# Patient Record
Sex: Female | Born: 1955 | Race: White | Hispanic: No | Smoking: Former smoker
Health system: Southern US, Community
[De-identification: ages and names within clinical notes are randomized; demographics above are authoritative.]

## PROBLEM LIST (undated history)

## (undated) DIAGNOSIS — I10 Essential (primary) hypertension: Secondary | ICD-10-CM

## (undated) DIAGNOSIS — E039 Hypothyroidism, unspecified: Secondary | ICD-10-CM

## (undated) DIAGNOSIS — E785 Hyperlipidemia, unspecified: Secondary | ICD-10-CM

## (undated) DIAGNOSIS — K631 Perforation of intestine (nontraumatic): Secondary | ICD-10-CM

## (undated) HISTORY — DX: Hyperlipidemia, unspecified: E78.5

## (undated) HISTORY — DX: Perforation of intestine (nontraumatic): K63.1

## (undated) HISTORY — DX: Essential (primary) hypertension: I10

## (undated) HISTORY — DX: Hypothyroidism, unspecified: E03.9

## (undated) HISTORY — PX: BREAST BIOPSY: SHX20

---

## 2008-01-31 DIAGNOSIS — K631 Perforation of intestine (nontraumatic): Secondary | ICD-10-CM

## 2008-01-31 HISTORY — DX: Perforation of intestine (nontraumatic): K63.1

## 2008-02-24 ENCOUNTER — Inpatient Hospital Stay (HOSPITAL_COMMUNITY): Admission: EM | Admit: 2008-02-24 | Discharge: 2008-02-29 | Payer: Self-pay | Admitting: Emergency Medicine

## 2009-01-22 ENCOUNTER — Encounter: Admission: RE | Admit: 2009-01-22 | Discharge: 2009-01-22 | Payer: Self-pay | Admitting: Family Medicine

## 2009-12-15 ENCOUNTER — Encounter: Admission: RE | Admit: 2009-12-15 | Discharge: 2009-12-15 | Payer: Self-pay | Admitting: Family Medicine

## 2010-06-15 ENCOUNTER — Encounter: Admission: RE | Admit: 2010-06-15 | Discharge: 2010-06-15 | Payer: Self-pay | Admitting: Obstetrics & Gynecology

## 2011-03-16 NOTE — Consult Note (Signed)
[name], [name]            ACCOUNT NO.:  [id number]   MEDICAL RECORD NO.:  [id number]          PATIENT TYPE:  INP   LOCATION:  5118                         FACILITY:  [facility]   PHYSICIAN:  [name], M.D. DATE OF BIRTH:  September 20, 1956   DATE OF CONSULTATION:  02/24/2008  DATE OF DISCHARGE:                                 CONSULTATION   REASON FOR CONSULTATION:  Free air after colonoscopy.   HISTORY OF PRESENT ILLNESS:  The patient is a 55 year old white female  in excellent health, who underwent a screening colonoscopy yesterday by  Dr. Charlott Terry.  According to his report, he performed a hot  polypectomy of 2 diminutive polyps at the splenic flexure.  He also took  several random cold biopsies from the remainder of the colon.  After the  patient returned to home, she began having increase in abdominal pain  and distention.  This was located mostly in the midline.  She developed  nausea and vomiting as well as diarrhea, which was nonbloody this  morning.  She remains afebrile.  With the worsening pain, the patient  came to the emergency department for evaluation.  Plain films showed a  tiny amount of free air and a prominent loop of jejunum consistent  with a mild jejunal ileus.  Dr. Ewing Terry is admitting the patient and we  are asked to consult.   PAST MEDICAL HISTORY:  Hypertension.   PAST SURGICAL HISTORY:  Some type of gynecologic vaginal exam under  anesthesia.   ALLERGIES:  ASPIRIN, which causes angioedema.   MEDICATIONS:  None.   SOCIAL HISTORY:  Nonsmoker, occasional alcohol use.   FAMILY HISTORY:  Noncontributory.   PHYSICAL EXAMINATION:  VITAL SIGNS:  Temperature 97.4, pulse 73, blood  pressure 116/78, respirations 80, and saturation is 98% on room air.  GENERAL:  This is a well-developed and well-nourished female in no  apparent distress.  HEENT:  EOMI.  Sclerae anicteric.  NECK:  No masses.  No thyromegaly.  LUNGS:  Clear.  Normal respiratory  effort.  HEART:  Regular rate and rhythm.  No murmur.  ABDOMEN:  Occasional bowel sounds, soft, and nondistended.  Tender along  the midline.  No rebound.  No guarding.  No palpable masses.  EXTREMITIES:  No edema.  SKIN:  Warm and dry with no sign of jaundice.   LABORATORY DATA:  White count 6.9, hemoglobin 13.8, and platelets  223,000.  Electrolytes within normal limits.   IMPRESSION:  A healthy 55 year old female with apparent perforation  after colonoscopy and biopsy.  She has localized abdominal tenderness  with no systemic signs of sepsis or generalized peritonitis.   RECOMMENDATIONS:  We will obtain a CT scan of the abdomen and pelvis  with IV contrast to rule out any abscess.  If there is no sign of  abscess and this is a localized contained perforation, we will try to  manage this conservatively with IV antibiotics and bowel rest.  If her  clinical status worsens, she will need exploration with repair of her  colon versus possible colon resection.  We will continue to follow with  you.  Discussed with Dr. Ewing Terry.      [name], M.D.  Electronically Signed     MKT/MEDQ  D:  02/24/2008  T:  02/25/2008  Job:  [id number]

## 2011-03-16 NOTE — H&P (Signed)
[name], GRAJALES            ACCOUNT NO.:  [id number]   MEDICAL RECORD NO.:  [id number]          PATIENT TYPE:  INP   LOCATION:  5118                         FACILITY:  [facility]   PHYSICIAN:  Petra Kuba, M.D.    DATE OF BIRTH:  [birth year]   DATE OF ADMISSION:  02/24/2008  DATE OF DISCHARGE:                              HISTORY & PHYSICAL   HISTORY:  The admitted with free air after colonoscopy.  She did have a  little bit of cramps afterwards, but today after eating a little low  meal got significant abdominal pain, called me, sounded in moderate  distress, had her son bring her to the emergency room where free air was  seen on flat and upright abdomen.  She has had no nausea, vomiting, or  fever, has not passed any gas.  She has had no previous GI issues.  Dr.  Bosie Clos did remove 2 polyps.  Unfortunately, they were done in our  office and I do not have that report at the current time.  I have put in  a call to Dr. Bosie Clos and our office manager to see if we can get the  reports of where the polyps were removed from, etc.   PAST MEDICAL HISTORY:  Fairly unremarkable.  She has some mild history  of hypertension and an exploratory lap by a gynecologist years ago.   FAMILY HISTORY:  Negative for any GI problems.   SOCIAL HISTORY:  Does not drink or smoke.  Minimizes over-the-counter  medicine use.   ALLERGIES:  ASPIRIN.   REVIEW OF SYSTEMS:  Negative except as above.   PHYSICAL EXAMINATION:  GENERAL:  No acute distress, although mildly  uncomfortable and lying in the bed.  VITAL SIGNS:  Stable, afebrile.  NECK:  Supple without adenopathy.  LUNGS:  Clear.  HEART:  Regular rate and rhythm.  ABDOMEN:  Surprisingly soft, rare bowel sounds, tender throughout, no  obvious rebound or significant guarding.   X-ray reported to me with some mild free air.  CBC is normal.  White  count 6.8, hemoglobin 13.8, platelet count 238.  Chemistries are normal  including albumin 3.9,  lipase normal, nonacetic.  BUN and creatinine  normal.   ASSESSMENT:  Colon perforation status post colonoscopy.   PLAN:  I have discussed case with Dr. Corliss Skains the surgeon and he will see  her to see if surgical options are needed or we should proceed with a  CAT scan.  In the meantime, we will use Cipro and Flagyl, keep her  comfortable with  Dilaudid and Phenergan and Tylenol p.r.n., try to get the colonoscopy  report or get in touch with Dr. Bosie Clos as above, and wait on Dr.  Fatima Sanger opinion for further workup plan, and I have discussed all the  above with both the patient and her son and they understands.           ______________________________  Petra Kuba, M.D.     MEM/MEDQ  D:  02/24/2008  T:  02/24/2008  Job:  [id number]   cc:   Wilmon Arms. Tsuei, M.D.  Vincent C.  Bosie Clos, MD

## 2011-03-19 NOTE — Discharge Summary (Signed)
NAMEJAICE, [name]            ACCOUNT NO.:  [id number]   MEDICAL RECORD NO.:  [id number]          PATIENT TYPE:  INP   LOCATION:  5154                         FACILITY:  [facility]   PHYSICIAN:  Shirley Friar, MDDATE OF BIRTH:  July 12, 1956   DATE OF ADMISSION:  02/24/2008  DATE OF DISCHARGE:  02/29/2008                               DISCHARGE SUMMARY   DISCHARGE DIAGNOSIS:  Microperforation of the colon, status post  polypectomy.   PROCEDURES:  None.   CONSULTS:  [facility] Surgery, Dr. Manus Rudd, February 24, 2008.   RADIOLOGICAL EXAMS:  1. Acute abdominal series taken on February 24, 2008, that showed tiny      amount of free peritoneal air compatible with bowel perforation.  2. Mild jejunal ileus.  3. CT of her abdomen and pelvis done on February 24, 2008, that showed      trace intraperitoneal free air as reported on prior abdominal      radiograph and CT of the pelvis showed short segmental colonic wall      thickening.  Colonic stranding and foci of free air are adjacent to      the mild mid transverse colon, likely the site of perforation, also      mild pelvic free fluid.  4. Chest x-ray February 27, 2008 done for nonproductive cough.   IMPRESSION:  Scarring left CP angle.  No acute cardiopulmonary disease.   BRIEF HISTORY AND PHYSICAL:  [name] is a 55 year old Caucasian  female who had some abdominal pain after colonoscopy with polypectomy.  She went to the emergency room where a flat and upright abdominal x-ray  showed free air.  She had no nausea, vomiting, or fever.  Her exam was  benign.  Her abdomen was soft with rare bowel sounds, tender throughout.  No obvious rebound or significant guarding.  CBC count on admission was  normal.  She was admitted to the hospital for observation.  A CT scan  was obtained to further clarify the microperforation.  A surgery consult  was obtained.  Dr. Manus Rudd saw the patient and felt that  conservative management  was the most appropriate course of action.  The  patient was observed over the next few days in the hospital.  Stool  cultures were obtained.  Routine stool culture showed reduced flora.  C.  diff was negative.  Cryptosporidium and Giardia were negative.  The  patient developed a nonproductive cough in the hospital.  Chest x-ray  was negative.  She also developed a small temperature of 100.3.  She was  treated with Cipro and Flagyl as well as Dilaudid pain medication and  Phenergan in the hospital.  Over the course of several days,  her pain  decreased.  She was able to tolerate first a clear-liquid diet and then  a full diet.  She was able to ambulate without pain.  The day of her  discharge, she was pain free, eating well, ambulating well, and was  ready to go home.   LABORATORY DATA:  Labs on February 28, 2008, which was the date of  discharge:  BMET  was within normal limits including a BUN of 4 and  creatinine 0.75.  CBC showed a hemoglobin of 12.1, hematocrit 35.6,  white count 5.2, and platelets 264,000.  The patient was discharged to  home in good condition.  She was given prescriptions for  14 days of Cipro 500 mg b.i.d. and Flagyl 500 mg t.i.d. as well as  Phenergan for any nausea at that she might have at home.  She was given  a followup appointment in 2 weeks with Dr. Bosie Clos and was advised not  to take any NSAIDs or aspirin and to call our office with increased pain  or fever.      Stephani Police, [place]      Shirley Friar, MD  Electronically Signed    MLY/MEDQ  D:  04/01/2008  T:  04/02/2008  Job:  [id number]   cc:   Shirley Friar, MD

## 2011-07-27 LAB — URINE MICROSCOPIC-ADD ON

## 2011-07-27 LAB — CBC
HCT: 33.1 — ABNORMAL LOW
HCT: 33.8 — ABNORMAL LOW
HCT: 35.6 — ABNORMAL LOW
Hemoglobin: 11.5 — ABNORMAL LOW
MCHC: 34.1
MCV: 94.2
MCV: 94.4
MCV: 95.4
Platelets: 170
Platelets: 182
Platelets: 205
Platelets: 223
Platelets: 264
RBC: 3.56 — ABNORMAL LOW
RBC: 3.59 — ABNORMAL LOW
RBC: 4.28
RDW: 12.6
WBC: 5.1
WBC: 5.2
WBC: 6.9
WBC: 7.5

## 2011-07-27 LAB — BASIC METABOLIC PANEL
BUN: 2 — ABNORMAL LOW
CO2: 23
CO2: 26
Calcium: 8.8
Chloride: 105
Chloride: 106
Creatinine, Ser: 0.72
Creatinine, Ser: 0.75
Creatinine, Ser: 0.78
GFR calc Af Amer: >60
GFR calc Af Amer: >60
GFR calc Af Amer: >60
GFR calc non Af Amer: >60
GFR calc non Af Amer: >60
Glucose, Bld: 127 — ABNORMAL HIGH
Glucose, Bld: 95
Potassium: 3.5
Sodium: 139
Sodium: 141

## 2011-07-27 LAB — COMPREHENSIVE METABOLIC PANEL
AST: 34
Albumin: 3.9
CO2: 24
Calcium: 9.1
Chloride: 104
Glucose, Bld: 118 — ABNORMAL HIGH
Potassium: 3.6
Sodium: 138
Total Protein: 7

## 2011-07-27 LAB — URINALYSIS, ROUTINE W REFLEX MICROSCOPIC
Glucose, UA: NEGATIVE
Ketones, ur: 40 — AB
Nitrite: NEGATIVE
Protein, ur: NEGATIVE
Specific Gravity, Urine: 1.024

## 2011-07-27 LAB — DIFFERENTIAL
Basophils Absolute: 0
Basophils Relative: 0
Eosinophils Absolute: 0
Eosinophils Relative: 1
Lymphocytes Relative: 15
Lymphs Abs: 1
Neutro Abs: 5.5
Neutrophils Relative %: 80 — ABNORMAL HIGH

## 2011-07-27 LAB — STOOL CULTURE

## 2011-07-27 LAB — GIARDIA/CRYPTOSPORIDIUM SCREEN(EIA): Cryptosporidium Screen (EIA): NEGATIVE

## 2011-08-02 ENCOUNTER — Other Ambulatory Visit: Payer: Self-pay | Admitting: Obstetrics & Gynecology

## 2011-08-02 DIAGNOSIS — Z1231 Encounter for screening mammogram for malignant neoplasm of breast: Secondary | ICD-10-CM

## 2011-08-10 ENCOUNTER — Ambulatory Visit
Admission: RE | Admit: 2011-08-10 | Discharge: 2011-08-10 | Disposition: A | Discharge status: Home / Self Care | Payer: No Typology Code available for payment source | Source: Ambulatory Visit | Attending: Obstetrics & Gynecology | Admitting: Obstetrics & Gynecology

## 2011-08-10 DIAGNOSIS — Z1231 Encounter for screening mammogram for malignant neoplasm of breast: Secondary | ICD-10-CM

## 2012-07-26 ENCOUNTER — Other Ambulatory Visit: Payer: Self-pay | Admitting: Gynecology

## 2012-08-10 ENCOUNTER — Other Ambulatory Visit: Payer: Self-pay | Admitting: Obstetrics & Gynecology

## 2012-08-10 ENCOUNTER — Other Ambulatory Visit: Payer: Self-pay | Admitting: Gynecology

## 2012-08-10 DIAGNOSIS — Z1231 Encounter for screening mammogram for malignant neoplasm of breast: Secondary | ICD-10-CM

## 2012-08-24 ENCOUNTER — Ambulatory Visit
Admission: RE | Admit: 2012-08-24 | Discharge: 2012-08-24 | Disposition: A | Discharge status: Home / Self Care | Payer: 59 | Source: Ambulatory Visit | Attending: Gynecology | Admitting: Gynecology

## 2012-08-24 DIAGNOSIS — Z1231 Encounter for screening mammogram for malignant neoplasm of breast: Secondary | ICD-10-CM

## 2012-11-01 DIAGNOSIS — E039 Hypothyroidism, unspecified: Secondary | ICD-10-CM

## 2012-11-01 HISTORY — DX: Hypothyroidism, unspecified: E03.9

## 2013-10-04 ENCOUNTER — Telehealth: Payer: Self-pay | Admitting: Gynecology

## 2013-10-04 NOTE — Telephone Encounter (Signed)
Patient calling needing refills on thyroid medication. Patient was due for AEX 07/26/13 but declines to schedule due to no insurance coverage at this time. She may have coverage starting January 2015 but does not want to schedule until she is sure.  [facility]  [address] [employer]

## 2013-10-04 NOTE — Telephone Encounter (Signed)
Last TSH check 11/13/12 = 1.205 Please advise if OK to refill.  Pt is overdue for AEX and does not want to schedule at this time.

## 2013-10-05 MED ORDER — LEVOTHYROXINE SODIUM 75 MCG PO TABS: 75.0000 ug | ORAL_TABLET | Freq: Every day | ORAL | Status: DC

## 2013-10-05 NOTE — Telephone Encounter (Signed)
Ok to refill for 63m only, thereafter will need both ov and lab to confirm right dose, she needs appt in January

## 2013-10-05 NOTE — Telephone Encounter (Signed)
Appt scheduled for 11/21/13, first available for Dr. Farrel Gobble.  RX sent until that time.

## 2013-11-20 ENCOUNTER — Encounter: Payer: Self-pay | Admitting: Gynecology

## 2013-11-21 ENCOUNTER — Encounter: Payer: Self-pay | Admitting: Gynecology

## 2013-11-21 ENCOUNTER — Ambulatory Visit (INDEPENDENT_AMBULATORY_CARE_PROVIDER_SITE_OTHER): Payer: 59 | Admitting: Gynecology

## 2013-11-21 VITALS — BP 140/88 | HR 80 | Resp 18 | Ht 66.0 in | Wt 148.0 lb

## 2013-11-21 DIAGNOSIS — E039 Hypothyroidism, unspecified: Secondary | ICD-10-CM

## 2013-11-21 DIAGNOSIS — Z01419 Encounter for gynecological examination (general) (routine) without abnormal findings: Secondary | ICD-10-CM

## 2013-11-21 LAB — TSH: TSH: 1.694 u[IU]/mL (ref 0.350–4.500)

## 2013-11-21 MED ORDER — LEVOTHYROXINE SODIUM 75 MCG PO TABS: 75.0000 ug | ORAL_TABLET | Freq: Every day | ORAL | Status: DC

## 2013-11-21 NOTE — Patient Instructions (Signed)

## 2013-11-21 NOTE — Progress Notes (Signed)
58 y.o.  G22P2002 Divorced Caucasian female  here for annual exam. Pt reports menses are absent.  She does report hot flashes (depends) , does not have night sweats, does not have vaginal dryness.  She is not using lubricants.  She does not report post-menopasual bleeding.  Pt lost job last year, is getting retraining  Patient's last menstrual period was 11/21/2006.          Sexually active: no  The current method of family planning is post menopausal status.    Exercising: yes  walking 2-3x/wk Last pap: 07/26/12 NEG HR HPV Abnormal PAP: Mammogram: 08/25/12 Bi-Rads BSE: no Colonoscopy: 2010 f/u on 10 years DEXA: 2010 Alcohol: 4-5 Drinks/wk Tobacco: no  Labs: Cammie Fulp,MD  ; Urine: Unable to void  Health Maintenance  Topic Date Due  . Influenza Vaccine  06/01/2013  . Mammogram  08/24/2014  . Pap Smear  07/27/2015  . Colonoscopy  11/01/2018  . Tetanus/tdap  11/01/2018    Family History  Problem Relation Age of Onset  . Cancer Maternal Grandmother   . Cancer Maternal Grandfather   . Diabetes Paternal Grandfather     There are no active problems to display for this patient.   Past Medical History  Diagnosis Date  . Colon perforation 01/2008    With Colonoscopy  . Hyperlipidemia   . Hypertension     History reviewed. No pertinent past surgical history.  Allergies: Asa and Motrin  Current Outpatient Prescriptions  Medication Sig Dispense Refill  . Calcium 1200-1000 MG-UNIT CHEW Chew by mouth.      . levothyroxine (SYNTHROID) 75 MCG tablet Take 1 tablet (75 mcg total) by mouth daily before breakfast.  30 tablet  1  . Multiple Vitamins-Minerals (MULTIVITAMIN PO) Take by mouth.      . rosuvastatin (CRESTOR) 10 MG tablet Take 10 mg by mouth every other day.      . Coenzyme Q10 (COQ10 PO) Take by mouth.      Marland Kitchen MAGNESIUM PO Take by mouth.       No current facility-administered medications for this visit.    ROS: Pertinent items are noted in HPI.  Exam:    BP 140/88   Pulse 80  Resp 18  Ht 5\' 6"  (1.676 m)  Wt 148 lb (67.132 kg)  BMI 23.90 kg/m2  LMP 11/21/2006 Weight change: @WEIGHTCHANGE @ Last 3 height recordings:  Ht Readings from Last 3 Encounters:  11/21/13 5\' 6"  (1.676 m)   General appearance: alert, cooperative and appears stated age Head: Normocephalic, without obvious abnormality, atraumatic Neck: no adenopathy, no carotid bruit, no JVD, supple, symmetrical, trachea midline and thyroid not enlarged, symmetric, no tenderness/mass/nodules Lungs: clear to auscultation bilaterally Breasts: normal appearance, no masses or tenderness Heart: regular rate and rhythm, S1, S2 normal, no murmur, click, rub or gallop Abdomen: soft, non-tender; bowel sounds normal; no masses,  no organomegaly Extremities: extremities normal, atraumatic, no cyanosis or edema Skin: Skin color, texture, turgor normal. No rashes or lesions Lymph nodes: Cervical, supraclavicular, and axillary nodes normal. no inguinal nodes palpated Neurologic: Grossly normal   Pelvic: External genitalia:  no lesions              Urethra: normal appearing urethra with no masses, tenderness or lesions              Bartholins and Skenes: normal                 Vagina: normal appearing vagina with normal color and discharge, no  lesions              Cervix: normal appearance              Pap taken: no        Bimanual Exam:  Uterus:  uterus is normal size, shape, consistency and nontender                                      Adnexa:    normal adnexa in size, nontender and no masses                                      Rectovaginal: Confirms                                      Anus:  normal sphincter tone, no lesions  A: well 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  An After Visit Summary was printed and given to the patient.

## 2013-11-21 NOTE — Addendum Note (Signed)
Addended by: Douglass RiversLATHROP, Jyles Sontag on: 11/21/2013 12:58 PM   Modules accepted: Orders

## 2013-11-22 ENCOUNTER — Telehealth: Payer: Self-pay | Admitting: *Deleted

## 2013-11-22 NOTE — Telephone Encounter (Signed)
Left Message To Call Back  

## 2013-11-22 NOTE — Telephone Encounter (Signed)
Patient notified

## 2013-11-22 NOTE — Telephone Encounter (Signed)
Message copied by Lorraine LaxSHAW, Lynda Wanninger J on Thu Nov 22, 2013 12:52 PM ------      Message from: Douglass RiversLATHROP, TRACY      Created: Thu Nov 22, 2013  9:56 AM       Inform TSH great, ok to pick up synthroid ------

## 2013-11-22 NOTE — Telephone Encounter (Signed)
See labs 

## 2013-11-22 NOTE — Telephone Encounter (Signed)
Pt returning call

## 2013-11-30 ENCOUNTER — Encounter: Payer: Self-pay | Admitting: Gynecology

## 2013-11-30 DIAGNOSIS — E039 Hypothyroidism, unspecified: Secondary | ICD-10-CM

## 2013-11-30 MED ORDER — LEVOTHYROXINE SODIUM 75 MCG PO TABS: 75.0000 ug | ORAL_TABLET | Freq: Every day | ORAL | Status: DC

## 2014-09-02 ENCOUNTER — Encounter: Payer: Self-pay | Admitting: Gynecology

## 2014-09-30 ENCOUNTER — Telehealth: Payer: Self-pay

## 2014-09-30 NOTE — Telephone Encounter (Signed)
S/W pt about rescheduling her appt with Dr. Farrel GobbleLathrop. New appt set on 11/22/14 @ 10:30 with Ms. Patty

## 2014-11-22 ENCOUNTER — Ambulatory Visit: Payer: Self-pay | Admitting: Nurse Practitioner

## 2014-11-22 ENCOUNTER — Ambulatory Visit: Payer: 59 | Admitting: Gynecology

## 2014-11-26 ENCOUNTER — Ambulatory Visit (INDEPENDENT_AMBULATORY_CARE_PROVIDER_SITE_OTHER): Payer: BLUE CROSS/BLUE SHIELD | Admitting: Nurse Practitioner

## 2014-11-26 ENCOUNTER — Encounter: Payer: Self-pay | Admitting: Nurse Practitioner

## 2014-11-26 ENCOUNTER — Other Ambulatory Visit: Payer: Self-pay | Admitting: Nurse Practitioner

## 2014-11-26 VITALS — BP 122/80 | HR 78 | Resp 14 | Ht 65.5 in | Wt 145.4 lb

## 2014-11-26 DIAGNOSIS — E559 Vitamin D deficiency, unspecified: Secondary | ICD-10-CM

## 2014-11-26 DIAGNOSIS — Z1231 Encounter for screening mammogram for malignant neoplasm of breast: Secondary | ICD-10-CM

## 2014-11-26 DIAGNOSIS — E038 Other specified hypothyroidism: Secondary | ICD-10-CM

## 2014-11-26 DIAGNOSIS — Z01419 Encounter for gynecological examination (general) (routine) without abnormal findings: Secondary | ICD-10-CM

## 2014-11-26 DIAGNOSIS — M8580 Other specified disorders of bone density and structure, unspecified site: Secondary | ICD-10-CM

## 2014-11-26 LAB — TSH: TSH: 1.993 u[IU]/mL (ref 0.350–4.500)

## 2014-11-26 MED ORDER — LEVOTHYROXINE SODIUM 75 MCG PO TABS: 75.0000 ug | ORAL_TABLET | Freq: Every day | ORAL | Status: DC

## 2014-11-26 NOTE — Progress Notes (Signed)
59 y.o. G36P2002 Divorced Caucasian Fe here for annual exam.  Not dating or SA for 15 years.  Patient's last menstrual period was 11/01/2006.          Sexually active: No.  The current method of family planning is post menopausal status.    Exercising: No.  The patient does not participate in regular exercise at present. Smoker:  no  Health Maintenance: Pap:  07/26/12 NEG HR HPV negative MMG:  08/25/12 Bi-Rads 1: scheduled 12/03/14 Colonoscopy:  01/2008 colon perforation.   Pre-cancerous Polyps f/u needed in  5 years BMD: 2008 - osteopenia  TDaP:  11/01/2008 Labs: Cain Saupe, MD   reports that she has quit smoking. She does not have any smokeless tobacco history on file. She reports that she drinks about 1.8 oz of alcohol per week. She reports that she does not use illicit drugs.  Past Medical History  Diagnosis Date  . Colon perforation 01/2008    With Colonoscopy  . Hyperlipidemia   . Hypertension     History reviewed. No pertinent past surgical history.  Current Outpatient Prescriptions  Medication Sig Dispense Refill  . Calcium 1200-1000 MG-UNIT CHEW Chew by mouth.    . levothyroxine (SYNTHROID) 75 MCG tablet Take 1 tablet (75 mcg total) by mouth daily before breakfast. 90 tablet 3  . Multiple Vitamins-Minerals (MULTIVITAMIN PO) Take by mouth.    . rosuvastatin (CRESTOR) 10 MG tablet Take 10 mg by mouth every other day.    . Coenzyme Q10 (COQ10 PO) Take by mouth.    Marland Kitchen MAGNESIUM PO Take by mouth.     No current facility-administered medications for this visit.    Family History  Problem Relation Age of Onset  . Cancer Maternal Grandmother   . Cancer Maternal Grandfather   . Diabetes Paternal Grandfather     ROS:  Pertinent items are noted in HPI.  Otherwise, a comprehensive ROS was negative.  Exam:   BP 122/80 mmHg  Pulse 78  Resp 14  Ht 5' 5.5" (1.664 m)  Wt 145 lb 6.4 oz (65.953 kg)  BMI 23.82 kg/m2  LMP 11/01/2006 Height: 5' 5.5" (166.4 cm) Ht Readings from  Last 3 Encounters:  11/26/14 5' 5.5" (1.664 m)  11/21/13  (1.676 m)    General appearance: alert, cooperative and appears stated age Head: Normocephalic, without obvious abnormality, atraumatic Neck: no adenopathy, supple, symmetrical, trachea midline and thyroid normal to inspection and palpation Lungs: clear to auscultation bilaterally Breasts: normal appearance, no masses or tenderness Heart: regular rate and rhythm Abdomen: soft, non-tender; no masses,  no organomegaly Extremities: extremities normal, atraumatic, no cyanosis or edema Skin: Skin color, texture, turgor normal. No rashes or lesions Lymph nodes: Cervical, supraclavicular, and axillary nodes normal. No abnormal inguinal nodes palpated Neurologic: Grossly normal   Pelvic: External genitalia:  no lesions              Urethra:  normal appearing urethra with no masses, tenderness or lesions              Bartholin's and Skene's: normal                 Vagina: normal appearing vagina with normal color and discharge, no lesions              Cervix: anteverted              Pap taken: No. 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

## 2014-11-26 NOTE — Patient Instructions (Signed)

## 2014-11-26 NOTE — Progress Notes (Signed)
[facility] and schedule patient for screening mammogram  12/03/14 at 4:00, patient is aware.

## 2014-11-27 LAB — VITAMIN D 25 HYDROXY (VIT D DEFICIENCY, FRACTURES): Vit D, 25-Hydroxy: 32 ng/mL (ref 30–100)

## 2014-11-28 NOTE — Progress Notes (Signed)
Reviewed personally.  M. Suzanne Cheryle Dark, MD.  

## 2014-12-03 ENCOUNTER — Ambulatory Visit: Payer: Self-pay

## 2014-12-04 ENCOUNTER — Ambulatory Visit
Admission: RE | Admit: 2014-12-04 | Discharge: 2014-12-04 | Disposition: A | Discharge status: Home / Self Care | Payer: BLUE CROSS/BLUE SHIELD | Source: Ambulatory Visit | Attending: Nurse Practitioner | Admitting: Nurse Practitioner

## 2014-12-04 ENCOUNTER — Telehealth: Payer: Self-pay | Admitting: Nurse Practitioner

## 2014-12-04 DIAGNOSIS — M8580 Other specified disorders of bone density and structure, unspecified site: Secondary | ICD-10-CM

## 2014-12-04 DIAGNOSIS — Z1231 Encounter for screening mammogram for malignant neoplasm of breast: Secondary | ICD-10-CM

## 2014-12-04 NOTE — Telephone Encounter (Signed)
Olegario MessierKathy is calling from the [facility] still waiting to get information on the last bone density. Pt is in the office right now.

## 2014-12-04 NOTE — Telephone Encounter (Signed)
[facility] and told them she has had a previous bone density. They need to know where and when as they have no baseline.

## 2014-12-04 NOTE — Telephone Encounter (Signed)
Valerie LevyCathy Terry [facility].  [facility], patient reported having a dexa in the past. Paper chart review, last Dexa was 2008 done by [facility].  Dx of Osteopenia on record.   Gave information to Wrightathy. She wants to know if the physician wants a comparison to 2008, if so, patient will need to have Dexa at [facility].   Advised would route question to provider to review and advise.

## 2014-12-05 ENCOUNTER — Ambulatory Visit
Admission: RE | Admit: 2014-12-05 | Discharge: 2014-12-05 | Disposition: A | Discharge status: Home / Self Care | Payer: BLUE CROSS/BLUE SHIELD | Source: Ambulatory Visit | Attending: Nurse Practitioner | Admitting: Nurse Practitioner

## 2014-12-05 NOTE — Telephone Encounter (Signed)
Regarding previous messages: Lyn[facility] stating [facility] because they weren't sure about the comparison. States they spoke with [facility] and b/c Ms Ascencion DikeBroadwell not a pt there they will not complete her scan. Lynden AngCathy states she will call pt to [facility].

## 2014-12-05 NOTE — Telephone Encounter (Signed)
No [facility] since she is no longer a [facility]. At [facility].

## 2014-12-05 NOTE — Telephone Encounter (Signed)
[facility] Will close encounter.

## 2014-12-20 ENCOUNTER — Telehealth: Payer: Self-pay | Admitting: Nurse Practitioner

## 2014-12-20 NOTE — Telephone Encounter (Signed)
Spoke with patient. Advised patient of results as seen below. Patient is agreeable. Consult appointment scheduled with Dr.Miller on 3/21 at 3pm. Patient is agreeable to date and time.  Routing to provider for final review. Patient agreeable to disposition. Will close encounter

## 2014-12-20 NOTE — Telephone Encounter (Signed)
Result Notes     Notes Recorded by Valerie FranklinPatricia Rolen-Grubb, FNP on 12/17/2014 at 7:35 PM Results via my chart: Dr. Hyacinth Terry initially established care with her and I feel she needs a consult visit to discuss this BMD. Please call her and see if questions but also schedule a consult visit. I will send a copy of this to Dr. Hyacinth Terry as well.  [name], Sorry for the delay in getting the bone density results back to you. We have to wait on the paper copy to reach [name] for an accurate comparison reading. The BMD shows a T Score: spine -2.6; left hip neck at -2.4. The lowest measurement at the spine is in the Osteoporosis range.  The last BMD done at [facility] on 05/03/2007 shows a T Score at the spine of -1.5 and left hip neck at -0.8. Even though these are different machines; this is still quite a loss from previous reading.  I recommend that you have a consult visit with Dr. Hyacinth Terry and review if there are other reasons that your BMD is so low at this time. We can do other labs to make sure no other problems and then discuss what the treatment options are. Last TSH and Vit D were acceptable ranges.

## 2014-12-20 NOTE — Telephone Encounter (Signed)
Pt requesting results from bone density test completed 2 weeks ago.

## 2015-01-20 ENCOUNTER — Institutional Professional Consult (permissible substitution): Payer: BLUE CROSS/BLUE SHIELD | Admitting: Obstetrics & Gynecology

## 2015-01-24 ENCOUNTER — Encounter: Payer: Self-pay | Admitting: Obstetrics & Gynecology

## 2015-01-27 ENCOUNTER — Institutional Professional Consult (permissible substitution): Payer: BLUE CROSS/BLUE SHIELD | Admitting: Obstetrics & Gynecology

## 2015-01-27 NOTE — Telephone Encounter (Signed)
Appointment cancelled. Family emergency. Patient had appointment scheduled for today, 01/27/15 with Dr. Hyacinth MeekerMiller.  Patient states in message she will call back to schedule.       Cc Starla Curl

## 2015-11-13 ENCOUNTER — Other Ambulatory Visit: Payer: Self-pay

## 2015-11-13 DIAGNOSIS — Z1231 Encounter for screening mammogram for malignant neoplasm of breast: Secondary | ICD-10-CM

## 2015-12-01 ENCOUNTER — Ambulatory Visit: Payer: BLUE CROSS/BLUE SHIELD | Admitting: Nurse Practitioner

## 2015-12-08 ENCOUNTER — Ambulatory Visit (INDEPENDENT_AMBULATORY_CARE_PROVIDER_SITE_OTHER): Payer: BLUE CROSS/BLUE SHIELD | Admitting: Nurse Practitioner

## 2015-12-08 ENCOUNTER — Encounter: Payer: Self-pay | Admitting: Nurse Practitioner

## 2015-12-08 ENCOUNTER — Ambulatory Visit: Payer: BLUE CROSS/BLUE SHIELD

## 2015-12-08 VITALS — BP 160/98 | HR 68 | Ht 65.5 in | Wt 146.0 lb

## 2015-12-08 DIAGNOSIS — Z01419 Encounter for gynecological examination (general) (routine) without abnormal findings: Secondary | ICD-10-CM | POA: Diagnosis not present

## 2015-12-08 DIAGNOSIS — E038 Other specified hypothyroidism: Secondary | ICD-10-CM | POA: Diagnosis not present

## 2015-12-08 DIAGNOSIS — E559 Vitamin D deficiency, unspecified: Secondary | ICD-10-CM

## 2015-12-08 DIAGNOSIS — Z0000 Encounter for general adult medical examination without abnormal findings: Secondary | ICD-10-CM

## 2015-12-08 DIAGNOSIS — M8580 Other specified disorders of bone density and structure, unspecified site: Secondary | ICD-10-CM

## 2015-12-08 MED ORDER — LEVOTHYROXINE SODIUM 75 MCG PO TABS: 75.0000 ug | ORAL_TABLET | Freq: Every day | ORAL | Status: AC

## 2015-12-08 NOTE — Progress Notes (Signed)
Patient scheduled for PCP Follow up with Dr. Jillyn Hidden for 12/12/15 at 10:15.

## 2015-12-08 NOTE — Progress Notes (Signed)
Patient ID: [name], female   DOB: 1956-09-25, 60 y.o.   MRN: [id number]  60 y.o. G38P2002 Divorced  Caucasian Fe here for annual exam.  No new diagnosis.  Will  notice that BP is up and down.  Has BP  cuff at home and will monitor.  We have discussed BMD again today and she will return for a consult visit with Dr. Hyacinth Meeker - last apt was cancelled because of travel out of town.  Ex husband (not father of her sons) died this past year.   Patient's last menstrual period was 11/01/2006.          Sexually active: No.  The current method of family planning is none.    Exercising: Yes.    walking at least five days per week Smoker:  no  Health Maintenance: Pap: 07/26/12 NEG HR HPV negative MMG:12/04/14, Bi-Rads 1: Negative; scheduled 12/17/15 Colonoscopy: 01/2008 colon perforation.Due this year, will schedule. BMD: 12/05/14 T Score, -2.6 Spine / -2.4 Left Femur Neck TDaP: 11/01/2008 Shingles: Never, discussed with Dr. Jillyn Hidden Hep C and HIV: discussed today Labs: Dr. Jillyn Hidden   reports that she quit smoking about 17 years ago. She has never used smokeless tobacco. She reports that she drinks about 1.8 oz of alcohol per week. She reports that she does not use illicit drugs.  Past Medical History  Diagnosis Date  . Colon perforation ([facility]) 01/2008    With Colonoscopy  . Hyperlipidemia   . Hypertension   . Hypothyroid 2014    History reviewed. No pertinent past surgical history.  Current Outpatient Prescriptions  Medication Sig Dispense Refill  . atorvastatin (LIPITOR) 10 MG tablet Take 1 tablet by mouth daily.    . Calcium 1200-1000 MG-UNIT CHEW Chew by mouth.    . fenofibrate micronized (ANTARA) 43 MG capsule Take 1 capsule by mouth daily.  6  . levothyroxine (SYNTHROID) 75 MCG tablet Take 1 tablet (75 mcg total) by mouth daily before breakfast. 90 tablet 3  . Multiple Vitamins-Minerals (MULTIVITAMIN PO) Take by mouth.     No current facility-administered medications for this visit.     Family History  Problem Relation Age of Onset  . Cancer Maternal Grandmother   . Cancer Maternal Grandfather   . Diabetes Paternal Grandfather   . Hyperlipidemia Father   . Heart disease Father     CABG x 4    ROS:  Pertinent items are noted in HPI.  Otherwise, a comprehensive ROS was negative.  Exam:   BP 164/92 mmHg  Pulse 68  Ht 5' 5.5" (1.664 m)  Wt 146 lb (66.225 kg)  BMI 23.92 kg/m2  LMP 11/01/2006 Height: 5' 5.5" (166.4 cm) Ht Readings from Last 3 Encounters:  12/08/15 5' 5.5" (1.664 m)  11/26/14 5' 5.5" (1.664 m)  11/21/13  (1.676 m)    General appearance: alert, cooperative and appears stated age Head: Normocephalic, without obvious abnormality, atraumatic Neck: no adenopathy, supple, symmetrical, trachea midline and thyroid normal to inspection and palpation Lungs: clear to auscultation bilaterally Breasts: normal appearance, no masses or tenderness Heart: regular rate and rhythm Abdomen: soft, non-tender; no masses,  no organomegaly Extremities: extremities normal, atraumatic, no cyanosis or edema Skin: Skin color, texture, turgor normal. No rashes or lesions Lymph nodes: Cervical, supraclavicular, and axillary nodes normal. No abnormal inguinal nodes palpated Neurologic: Grossly normal   Pelvic: External genitalia:  no lesions              Urethra:  normal appearing urethra with  no masses, tenderness or lesions              Bartholin's and Skene's: normal                 Vagina: normal appearing vagina with normal color and discharge, no lesions              Cervix: anteverted              Pap taken: Yes.   Bimanual Exam:  Uterus:  normal size, contour, position, consistency, mobility, non-tender              Adnexa: no mass, fullness, tenderness               Rectovaginal: Confirms               Anus:  normal sphincter tone, no lesions  Chaperone present: yes  A:  Well Woman with normal exam  Hypothyroid on  replacement Osteoporosis hyperlipidemia  Elevated BP    P:   Reviewed health and wellness pertinent to exam  Pap smear as above  Mammogram is scheduled for next week.    We will schedule apt for her to see PCP about elevated BP - [facility] is calliing for apt.  Will follow with labs  She will return to see Dr. Hyacinth Meeker for BMD consult  Counseled on breast self exam, mammography screening, adequate intake of calcium and vitamin D, diet and exercise return annually or prn  An After Visit Summary was printed and given to the patient.

## 2015-12-08 NOTE — Patient Instructions (Addendum)

## 2015-12-09 LAB — TSH: TSH: 0.98 mIU/L

## 2015-12-09 LAB — HIV ANTIBODY (ROUTINE TESTING W REFLEX): HIV: NONREACTIVE

## 2015-12-09 LAB — HEPATITIS C ANTIBODY: HCV AB: NEGATIVE

## 2015-12-09 LAB — VITAMIN D 25 HYDROXY (VIT D DEFICIENCY, FRACTURES): VIT D 25 HYDROXY: 36 ng/mL (ref 30–100)

## 2015-12-10 LAB — IPS PAP TEST WITH HPV

## 2015-12-10 NOTE — Progress Notes (Signed)
Encounter reviewed by Dr. Brook Amundson C. Silva.  

## 2015-12-15 ENCOUNTER — Ambulatory Visit (INDEPENDENT_AMBULATORY_CARE_PROVIDER_SITE_OTHER): Payer: BLUE CROSS/BLUE SHIELD | Admitting: Obstetrics & Gynecology

## 2015-12-15 ENCOUNTER — Encounter: Payer: Self-pay | Admitting: Obstetrics & Gynecology

## 2015-12-15 VITALS — BP 150/86 | HR 80 | Resp 16 | Ht 65.5 in | Wt 144.0 lb

## 2015-12-15 DIAGNOSIS — E215 Disorder of parathyroid gland, unspecified: Secondary | ICD-10-CM

## 2015-12-15 DIAGNOSIS — E031 Congenital hypothyroidism without goiter: Secondary | ICD-10-CM

## 2015-12-15 DIAGNOSIS — M810 Age-related osteoporosis without current pathological fracture: Secondary | ICD-10-CM

## 2015-12-15 DIAGNOSIS — R7989 Other specified abnormal findings of blood chemistry: Secondary | ICD-10-CM

## 2015-12-15 NOTE — Progress Notes (Signed)
Subjective:    79 yrs Divorced Caucasian G2P2002  female here to discuss recent BMD obtained 12/06/15 showing osteoporosis in her spine with T score -2.6 in spine and -2.4 in hip.  Reviewed prior BMD.  Although she does now have the diagnosis of osteoporosis, her BMD has changed very minimally over the last five years when t score in spine was -2.4.  Pt brings a bag of vitamins with her and I reviewed what she is taking.  She's getting about  calcium plus dietary calcium and 1400 IU Vit D  Osteoporosis Risk Factors  Nonmodifiable Personal Hx of fracture as an adult: yes - after a fall 2013.  Broke three ribs. Hx of fracture in first-degree relative: no Caucasian race: yes Advanced age: no Female sex: yes Dementia: no Poor health/frailty: no  Potentially modifiable: Tobacco use: no Low body weight (<127 lbs): no Estrogen deficiency  early menopause (age <45) or bilateral ovariectomy: no  prolonged premenopausal amenorrhea (>1 yr): no Low calcium intake (lifelong): no Alcohol use more than 2 drinks per day: no Recurrent falls: no Inadequate physical activity: no  Current calcium and Vit D intake:   calicum, 1400 IU Vit D  Review of Systems A comprehensive review of systems was negative.     Objective:   PHYSICAL EXAM BP 150/86 mmHg  Pulse 80  Resp 16  Ht 5' 5.5" (1.664 m)  Wt 144 lb (65.318 kg)  BMI 23.59 kg/m2  LMP 11/01/2006 General appearance: alert, cooperative, appears stated age and no distress  Imaging Bone Density: Spine T Score: -2.6, Hip T Score: -2.4 done on 12/06/15. FRAX score:  Not calculated due to presence of osteoporosis.  Discussion:  Testing for secondary causes of osteoporosis reviewed.  Pt had TSH and Vit D obtained 12/08/15.  Will also test PTH and intact calcium level.  Reviewed treatment options including bisphosphonates, Evista, Prolia.  Pt does not want to be on treatment yet so plan to follow BMD closely and can discuss treatment  options if changes continue.                           Assessment:   Osteoporosis with T score -2.6 in spine and hx of three broke ribs after a fall H/O hypothryoidism with adequate treatment   Plan:   PTH with intact calcium obtained today. Pt will increase weight bearing exercises to five days weekly.  Pt is walking 20-40 minutes but will increase frequency Continue calcium and Vit D supplementation Repeat BMD 2 years  ~15 minutes spent with patient >50% of time was in face to face discussion of above.

## 2015-12-15 NOTE — Patient Instructions (Signed)
With next calcium purchase, try to get one that has 1000 IU of Vit D

## 2015-12-17 ENCOUNTER — Ambulatory Visit
Admission: RE | Admit: 2015-12-17 | Discharge: 2015-12-17 | Disposition: A | Discharge status: Home / Self Care | Payer: BLUE CROSS/BLUE SHIELD | Source: Ambulatory Visit

## 2015-12-17 DIAGNOSIS — Z1231 Encounter for screening mammogram for malignant neoplasm of breast: Secondary | ICD-10-CM

## 2015-12-17 LAB — PTH, INTACT AND CALCIUM
Calcium: 8.8 mg/dL (ref 8.4–10.5)
PTH: 95 pg/mL — ABNORMAL HIGH (ref 14–64)

## 2015-12-25 NOTE — Addendum Note (Signed)
Addended by: Jerene Bears on: 12/25/2015 04:09 PM   Modules accepted: Orders

## 2015-12-26 ENCOUNTER — Telehealth: Payer: Self-pay | Admitting: Emergency Medicine

## 2015-12-26 NOTE — Telephone Encounter (Signed)
-----   Message from Jerene Bears, MD sent at 12/25/2015  4:07 PM EST ----- Please let pt know her PTH is mildly elevated but the calcium level is normal.  I'm going to refer her to an endocrinologist.  They typically just watch this but I want them to watch this in case it needs to be treated.  (elevated PTH can be evidence of hyperparathyroidism that can increase risks of osteoporosis.  Pt recently seen for consultation regarding her bone density.)

## 2015-12-26 NOTE — Telephone Encounter (Signed)
Patient notified of results and referral placed to Dr. Everardo All verbalized understanding of results and follow up recommended. She will follow up with endocrinology and return call with any concerns.

## 2016-01-21 ENCOUNTER — Ambulatory Visit: Payer: BLUE CROSS/BLUE SHIELD | Admitting: Endocrinology

## 2016-01-29 ENCOUNTER — Encounter: Payer: Self-pay | Admitting: Endocrinology

## 2016-01-29 ENCOUNTER — Ambulatory Visit (INDEPENDENT_AMBULATORY_CARE_PROVIDER_SITE_OTHER): Payer: BLUE CROSS/BLUE SHIELD | Admitting: Endocrinology

## 2016-01-29 VITALS — BP 122/87 | HR 72 | Temp 98.3°F | Wt 144.0 lb

## 2016-01-29 DIAGNOSIS — M810 Age-related osteoporosis without current pathological fracture: Secondary | ICD-10-CM | POA: Diagnosis not present

## 2016-01-29 NOTE — Progress Notes (Signed)
Subjective:    Patient ID: [name], female    DOB: 1956-02-06, 60 y.o.   MRN: [id number]  HPI Pt was noted to have osteoporosis in 2016.  She has never been on medication for this.  Her only bony fractures were 3 ribs (2014, traumatic).  She has no history of any of the following: early menopause, multiple myeloma, renal dz, prolonged bedrest, steroids, alcoholism, liver dz, and vid-d deficiency.  She does not take heparin or anticonvulsants.  She quit smoking in approx 1997.  She has slight intermittent pain at the lower back, but no assoc numbness.   Past Medical History  Diagnosis Date  . Colon perforation ([place]) 01/2008    With Colonoscopy  . Hyperlipidemia   . Hypertension   . Hypothyroid 2014    No past surgical history on file.  Social History   Social History  . Marital Status: Divorced    Spouse Name: N/A  . Number of Children: N/A  . Years of Education: N/A   Occupational History  . Not on file.   Social History Main Topics  . Smoking status: Former Smoker -- 1.50 packs/day for 20 years    Quit date: 11/01/1998  . Smokeless tobacco: Never Used  . Alcohol Use: 1.8 oz/week    3 Standard drinks or equivalent per week  . Drug Use: No  . Sexual Activity: No   Other Topics Concern  . Not on file   Social History Narrative    Current Outpatient Prescriptions on File Prior to Visit  Medication Sig Dispense Refill  . atorvastatin (LIPITOR) 10 MG tablet Take 1 tablet by mouth daily.    . fenofibrate micronized (ANTARA) 43 MG capsule Take 1 capsule by mouth daily.  6  . levothyroxine (SYNTHROID) 75 MCG tablet Take 1 tablet (75 mcg total) by mouth daily before breakfast. 90 tablet 3  . Multiple Vitamins-Minerals (MULTIVITAMIN PO) Take by mouth.     No current facility-administered medications on file prior to visit.    Allergies  Allergen Reactions  . Asa [Aspirin] Anaphylaxis and Swelling  . Motrin [Ibuprofen] Anaphylaxis    Family History  Problem  Relation Age of Onset  . Cancer Maternal Grandmother   . Cancer Maternal Grandfather   . Diabetes Paternal Grandfather   . Hyperlipidemia Father   . Heart disease Father     CABG x 4  . Osteoporosis Neg Hx     BP 122/87 mmHg  Pulse 72  Temp(Src) 98.3 F (36.8 C) (Oral)  Wt 144 lb (65.318 kg)  SpO2 97%  LMP 11/01/2006   Review of Systems denies weight loss, hematuria, n/v, edema, skin rash, insomnia, falls, cramps, andmemory loss.  She has cold intolerance, easy bruising, and rhinorrhea.    Objective:   Physical Exam VS: see vs page GEN: no distress HEAD: head: no deformity eyes: no periorbital swelling, no proptosis external nose and ears are normal mouth: no lesion seen NECK: supple, thyroid is not enlarged CHEST WALL: no kyphosis LUNGS: clear to auscultation CV: reg rate and rhythm, no murmur ABD: abdomen is soft, nontender.  no hepatosplenomegaly.  not distended.  no hernia. MUSCULOSKELETAL: muscle bulk and strength are grossly normal.  no obvious joint swelling.  gait is normal and steady EXTEMITIES: no deformity.  no ulcer on the feet.  feet are of normal color and temp.  no edema PULSES: dorsalis pedis intact bilat.  no carotid bruit NEURO:  cn 2-12 grossly intact.   readily moves all  4's.  sensation is intact to touch on the feet SKIN:  Normal texture and temperature.  No rash or suspicious lesion is visible.   NODES:  None palpable at the neck PSYCH: alert, well-oriented.  Does not appear anxious nor depressed.  Lab Results  Component Value Date   PTH 95* 12/15/2015   CALCIUM 8.8 12/15/2015    DEXA:  AP LUMBAR SPINE (L1, L3, L4): T-Score: -2.6 LEFT FEMUR NECK: T-Score: -2.4  I have reviewed outside records, and summarized: Pt was noted to have osteoporosis, and referred here.     Assessment & Plan:  Osteoporosis, new to me.  I advised medication, but pt declines for now.    Patient is advised the following: Patient Instructions  it is critically  important to prevent falling down (keep floor areas well-lit, dry, and free of loose objects.  If you have a cane, walker, or wheelchair, you should use it, even for short trips around the house.  Wear flat-soled shoes.  Also, try not to rush) Take calcium 1200 mg per day, and vitamin-D, 400 units per day.   You meet criteria for medication by your "FRAX" and "T-score" of the spine.  Please let us know if you decide to take medication. Please will be due for another bone-density test in1 year.  Edman Circle would be hapy to do this for you. I would be happy to see you back here as necessary.

## 2016-01-29 NOTE — Patient Instructions (Addendum)
it is critically important to prevent falling down (keep floor areas well-lit, dry, and free of loose objects.  If you have a cane, walker, or wheelchair, you should use it, even for short trips around the house.  Wear flat-soled shoes.  Also, try not to rush) Take calcium 1200 mg per day, and vitamin-D, 400 units per day.   You meet criteria for medication by your "FRAX" and "T-score" of the spine.  Please let us know if you decide to take medication. Please will be due for another bone-density test in1 year.  Shirlyn Goltzatty Grubb would be hapy to do this for you. I would be happy to see you back here as necessary.

## 2016-01-31 DIAGNOSIS — M810 Age-related osteoporosis without current pathological fracture: Secondary | ICD-10-CM | POA: Insufficient documentation

## 2016-09-10 ENCOUNTER — Other Ambulatory Visit: Payer: Self-pay | Admitting: Gastroenterology

## 2016-11-22 ENCOUNTER — Ambulatory Visit (HOSPITAL_COMMUNITY): Admit: 2016-11-22 | Payer: BLUE CROSS/BLUE SHIELD | Admitting: Gastroenterology

## 2016-11-22 ENCOUNTER — Encounter (HOSPITAL_COMMUNITY): Payer: Self-pay

## 2016-11-22 SURGERY — COLONOSCOPY WITH PROPOFOL
Anesthesia: Monitor Anesthesia Care

## 2016-12-03 ENCOUNTER — Other Ambulatory Visit: Payer: Self-pay | Admitting: Nurse Practitioner

## 2016-12-03 DIAGNOSIS — Z1231 Encounter for screening mammogram for malignant neoplasm of breast: Secondary | ICD-10-CM

## 2016-12-06 ENCOUNTER — Ambulatory Visit (INDEPENDENT_AMBULATORY_CARE_PROVIDER_SITE_OTHER): Payer: BLUE CROSS/BLUE SHIELD | Admitting: Nurse Practitioner

## 2016-12-06 ENCOUNTER — Encounter: Payer: Self-pay | Admitting: Nurse Practitioner

## 2016-12-06 VITALS — BP 144/96 | HR 88 | Ht 65.5 in | Wt 146.0 lb

## 2016-12-06 DIAGNOSIS — R899 Unspecified abnormal finding in specimens from other organs, systems and tissues: Secondary | ICD-10-CM

## 2016-12-06 DIAGNOSIS — Z01411 Encounter for gynecological examination (general) (routine) with abnormal findings: Secondary | ICD-10-CM

## 2016-12-06 DIAGNOSIS — M810 Age-related osteoporosis without current pathological fracture: Secondary | ICD-10-CM | POA: Diagnosis not present

## 2016-12-06 DIAGNOSIS — Z1211 Encounter for screening for malignant neoplasm of colon: Secondary | ICD-10-CM

## 2016-12-06 DIAGNOSIS — Z0000 Encounter for general adult medical examination without abnormal findings: Secondary | ICD-10-CM

## 2016-12-06 DIAGNOSIS — E559 Vitamin D deficiency, unspecified: Secondary | ICD-10-CM | POA: Diagnosis not present

## 2016-12-06 LAB — LIPID PANEL
Cholesterol: 268 mg/dL — ABNORMAL HIGH (ref <200)
HDL: 116 mg/dL (ref >50)
LDL CALC: 96 mg/dL (ref <100)
Total CHOL/HDL Ratio: 2.3 Ratio (ref <5.0)
Triglycerides: 279 mg/dL — ABNORMAL HIGH (ref <150)
VLDL: 56 mg/dL — AB (ref <30)

## 2016-12-06 LAB — COMPREHENSIVE METABOLIC PANEL
ALK PHOS: 46 U/L (ref 33–130)
ALT: 39 U/L — ABNORMAL HIGH (ref 6–29)
AST: 51 U/L — ABNORMAL HIGH (ref 10–35)
Albumin: 4.7 g/dL (ref 3.6–5.1)
BUN: 15 mg/dL (ref 7–25)
CALCIUM: 10.2 mg/dL (ref 8.6–10.4)
CO2: 27 mmol/L (ref 20–31)
Chloride: 100 mmol/L (ref 98–110)
Creat: 0.76 mg/dL (ref 0.50–0.99)
GLUCOSE: 95 mg/dL (ref 65–99)
POTASSIUM: 3.8 mmol/L (ref 3.5–5.3)
Sodium: 139 mmol/L (ref 135–146)
Total Bilirubin: 0.9 mg/dL (ref 0.2–1.2)
Total Protein: 7.3 g/dL (ref 6.1–8.1)

## 2016-12-06 LAB — HEMOGLOBIN A1C
Hgb A1c MFr Bld: 4.7 % (ref <5.7)
MEAN PLASMA GLUCOSE: 88 mg/dL

## 2016-12-06 LAB — CBC
HCT: 42.4 % (ref 35.0–45.0)
Hemoglobin: 14.4 g/dL (ref 11.7–15.5)
MCH: 33 pg (ref 27.0–33.0)
MCHC: 34 g/dL (ref 32.0–36.0)
MCV: 97.2 fL (ref 80.0–100.0)
MPV: 9.1 fL (ref 7.5–12.5)
Platelets: 242 10*3/uL (ref 140–400)
RBC: 4.36 MIL/uL (ref 3.80–5.10)
RDW: 13.1 % (ref 11.0–15.0)
WBC: 6.5 10*3/uL (ref 3.8–10.8)

## 2016-12-06 LAB — TSH: TSH: 1.56 m[IU]/L

## 2016-12-06 NOTE — Progress Notes (Signed)
Patient ID: [name], female   DOB: 11/22/1955, 61 y.o.   MRN: [id number]  61 y.o. 412P2002 Divorced  Caucasian Fe here for annual exam.  Not dating or SA.  Feels well.  She did see Dr. Everardo AllEllison and was recommended to take medication - she declined.  She does need DEXA this year.  The PTH was elevated last year at 95; calcium was normal.  Patient's last menstrual period was 11/01/2006.          Sexually active: No.  The current method of family planning is abstinence and post menopausal status.    Exercising: No.  The patient does not participate in regular exercise at present. Smoker:  no  Health Maintenance: Pap: 12/08/15, Negative with neg HR HPV  07/26/12, Negative with neg HR HPV MMG:12/17/15, Bi-Rads 1: Negative; scheduled 12/17/16 Colonoscopy: 01/2008 colon perforation.Patient declines. BMD: 12/05/14 T Score, -2.6 Spine / -2.4 Left Femur Neck TDaP:03/29/09 Shingles: Never, discussed with Dr. Jillyn HiddenFulp Hep C and HIV: 12/08/15 Labs: PCP takes care of all labs   reports that she quit smoking about 18 years ago. She has a 30.00 pack-year smoking history. She has never used smokeless tobacco. She reports that she drinks about 1.8 oz of alcohol per week . She reports that she does not use drugs.  Past Medical History:  Diagnosis Date  . Colon perforation ([facility]) 01/2008   With Colonoscopy  . Hyperlipidemia   . Hypertension   . Hypothyroid 2014    History reviewed. No pertinent surgical history.  Current Outpatient Prescriptions  Medication Sig Dispense Refill  . atorvastatin (LIPITOR) 10 MG tablet Take 1 tablet by mouth daily.    . fenofibrate micronized (ANTARA) 43 MG capsule Take 1 capsule by mouth daily.  6  . levothyroxine (SYNTHROID) 75 MCG tablet Take 1 tablet (75 mcg total) by mouth daily before breakfast. 90 tablet 3  . Multiple Vitamins-Minerals (MULTIVITAMIN PO) Take by mouth.     No current facility-administered medications for this visit.     Family History  Problem  Relation Age of Onset  . Hyperlipidemia Father   . Heart disease Father     CABG x 4  . Cancer Maternal Grandmother     unknown  . Cancer Maternal Grandfather     unknown  . Diabetes Paternal Grandfather   . Osteoporosis Neg Hx     ROS:  Pertinent items are noted in HPI.  Otherwise, a comprehensive ROS was negative.  Exam:   BP (!) 144/96 (BP Location: Right Arm, Patient Position: Sitting, Cuff Size: Normal)   Pulse 88   Ht 5' 5.5" (1.664 m)   Wt 146 lb (66.2 kg)   LMP 11/01/2006   BMI 23.93 kg/m  Height: 5' 5.5" (166.4 cm) Ht Readings from Last 3 Encounters:  12/06/16 5' 5.5" (1.664 m)  12/15/15 5' 5.5" (1.664 m)  12/08/15 5' 5.5" (1.664 m)    General appearance: alert, cooperative and appears stated age Head: Normocephalic, without obvious abnormality, atraumatic Neck: no adenopathy, supple, symmetrical, trachea midline and thyroid normal to inspection and palpation Lungs: clear to auscultation bilaterally Breasts: normal appearance, no masses or tenderness Heart: regular rate and rhythm Abdomen: soft, non-tender; no masses,  no organomegaly Extremities: extremities normal, atraumatic, no cyanosis or edema Skin: Skin color, texture, turgor normal. No rashes or lesions Lymph nodes: Cervical, supraclavicular, and axillary nodes normal. No abnormal inguinal nodes palpated Neurologic: Grossly normal   Pelvic: External genitalia:  no lesions  Urethra:  normal appearing urethra with no masses, tenderness or lesions              Bartholin's and Skene's: normal                 Vagina: normal appearing vagina with normal color and discharge, no lesions              Cervix: anteverted              Pap taken: No. Bimanual Exam:  Uterus:  normal size, contour, position, consistency, mobility, non-tender              Adnexa: no mass, fullness, tenderness               Rectovaginal: Confirms               Anus:  normal sphincter tone, no lesions  Chaperone  present: yes  A:  Well Woman with normal exam  Hypothyroid on replacement Osteoporosis - not on any medication - saw Endocrinologist hyperlipidemia             Elevated BP - white coat   Past colonoscopy with colon perforation 01/2008 - declines a repeat at this time    P:   Reviewed health and wellness pertinent to exam  Pap smear not done  Mammogram is due this month and will get a repeat BMD  Will follow with labs - repeat PTH with calcium per Dr. Hyacinth Meeker (it was elevated last yr).  IFOB is given - declines colonoscopy  Counseled on breast self exam, mammography screening, osteoporosis, adequate intake of calcium and vitamin D, diet and exercise, Kegel's exercises return annually or prn  An After Visit Summary was printed and given to the patient.

## 2016-12-06 NOTE — Patient Instructions (Addendum)

## 2016-12-07 LAB — PTH, INTACT AND CALCIUM
Calcium: 10.2 mg/dL (ref 8.6–10.4)
PTH: 23 pg/mL (ref 14–64)

## 2016-12-07 LAB — VITAMIN D 25 HYDROXY (VIT D DEFICIENCY, FRACTURES): Vit D, 25-Hydroxy: 36 ng/mL (ref 30–100)

## 2016-12-07 NOTE — Progress Notes (Signed)
Encounter reviewed by Dr. Brook Amundson C. Silva.  

## 2016-12-10 NOTE — Addendum Note (Signed)
Addended by: Luisa DagoPHILLIPS, Sajjad Honea C on: 12/10/2016 04:21 PM   Modules accepted: Orders

## 2016-12-17 ENCOUNTER — Other Ambulatory Visit: Payer: Self-pay

## 2016-12-17 ENCOUNTER — Ambulatory Visit
Admission: RE | Admit: 2016-12-17 | Discharge: 2016-12-17 | Disposition: A | Discharge status: Home / Self Care | Payer: BLUE CROSS/BLUE SHIELD | Source: Ambulatory Visit | Attending: Nurse Practitioner | Admitting: Nurse Practitioner

## 2016-12-17 ENCOUNTER — Ambulatory Visit: Payer: BLUE CROSS/BLUE SHIELD

## 2016-12-17 DIAGNOSIS — Z1231 Encounter for screening mammogram for malignant neoplasm of breast: Secondary | ICD-10-CM

## 2016-12-20 ENCOUNTER — Other Ambulatory Visit: Payer: Self-pay | Admitting: Nurse Practitioner

## 2016-12-20 DIAGNOSIS — R928 Other abnormal and inconclusive findings on diagnostic imaging of breast: Secondary | ICD-10-CM

## 2016-12-22 ENCOUNTER — Ambulatory Visit
Admission: RE | Admit: 2016-12-22 | Discharge: 2016-12-22 | Disposition: A | Discharge status: Home / Self Care | Payer: BLUE CROSS/BLUE SHIELD | Source: Ambulatory Visit | Attending: Nurse Practitioner | Admitting: Nurse Practitioner

## 2016-12-22 ENCOUNTER — Other Ambulatory Visit: Payer: Self-pay | Admitting: Nurse Practitioner

## 2016-12-22 DIAGNOSIS — N6310 Unspecified lump in the right breast, unspecified quadrant: Secondary | ICD-10-CM

## 2016-12-22 DIAGNOSIS — R928 Other abnormal and inconclusive findings on diagnostic imaging of breast: Secondary | ICD-10-CM

## 2016-12-23 ENCOUNTER — Ambulatory Visit
Admission: RE | Admit: 2016-12-23 | Discharge: 2016-12-23 | Disposition: A | Discharge status: Home / Self Care | Payer: BLUE CROSS/BLUE SHIELD | Source: Ambulatory Visit | Attending: Nurse Practitioner | Admitting: Nurse Practitioner

## 2016-12-23 ENCOUNTER — Other Ambulatory Visit (HOSPITAL_COMMUNITY): Payer: Self-pay | Admitting: Diagnostic Radiology

## 2016-12-23 ENCOUNTER — Other Ambulatory Visit: Payer: Self-pay | Admitting: Nurse Practitioner

## 2016-12-23 DIAGNOSIS — N6310 Unspecified lump in the right breast, unspecified quadrant: Secondary | ICD-10-CM

## 2016-12-23 HISTORY — PX: BREAST BIOPSY: SHX20

## 2017-01-26 ENCOUNTER — Telehealth: Payer: Self-pay | Admitting: *Deleted

## 2017-01-26 NOTE — Telephone Encounter (Signed)
Ok to close

## 2017-01-26 NOTE — Telephone Encounter (Signed)
I spoke to the patient regarding repeat liver function tests.  Patient states she has other things going on right now and does not see the necessity of repeating at this time.  Declines scheduling appointment.  Please advise next step.

## 2017-01-26 NOTE — Telephone Encounter (Signed)
-----   Message from Ria CommentPatricia Grubb, FNP sent at 01/12/2017  4:27 PM EDT ----- Please call or send letter about needing repeat labs. ----- Message ----- From: SYSTEM Sent: 01/12/2017  12:06 AM To: Ria CommentPatricia Grubb, FNP

## 2017-02-16 ENCOUNTER — Encounter: Payer: Self-pay | Admitting: *Deleted

## 2017-02-16 NOTE — Progress Notes (Signed)
Patient has not returned IFOB kit.  IFOB letter mailed. Order extended 30 days. 

## 2017-12-27 ENCOUNTER — Other Ambulatory Visit: Payer: Self-pay | Admitting: Nurse Practitioner

## 2017-12-27 DIAGNOSIS — Z1231 Encounter for screening mammogram for malignant neoplasm of breast: Secondary | ICD-10-CM

## 2018-01-12 ENCOUNTER — Ambulatory Visit
Admission: RE | Admit: 2018-01-12 | Discharge: 2018-01-12 | Disposition: A | Discharge status: Home / Self Care | Payer: BLUE CROSS/BLUE SHIELD | Source: Ambulatory Visit | Attending: Nurse Practitioner | Admitting: Nurse Practitioner

## 2018-01-12 DIAGNOSIS — Z1231 Encounter for screening mammogram for malignant neoplasm of breast: Secondary | ICD-10-CM

## 2018-07-11 ENCOUNTER — Other Ambulatory Visit: Payer: Self-pay | Admitting: Family Medicine

## 2018-07-11 DIAGNOSIS — M810 Age-related osteoporosis without current pathological fracture: Secondary | ICD-10-CM

## 2018-09-06 ENCOUNTER — Ambulatory Visit
Admission: RE | Admit: 2018-09-06 | Discharge: 2018-09-06 | Disposition: A | Discharge status: Home / Self Care | Payer: BLUE CROSS/BLUE SHIELD | Source: Ambulatory Visit | Attending: Family Medicine | Admitting: Family Medicine

## 2018-09-06 DIAGNOSIS — M810 Age-related osteoporosis without current pathological fracture: Secondary | ICD-10-CM

## 2019-11-22 ENCOUNTER — Other Ambulatory Visit: Payer: Self-pay | Admitting: Family Medicine

## 2019-11-22 DIAGNOSIS — Z1231 Encounter for screening mammogram for malignant neoplasm of breast: Secondary | ICD-10-CM

## 2019-12-28 ENCOUNTER — Other Ambulatory Visit: Payer: Self-pay

## 2019-12-28 ENCOUNTER — Ambulatory Visit
Admission: RE | Admit: 2019-12-28 | Discharge: 2019-12-28 | Disposition: A | Discharge status: Home / Self Care | Payer: BLUE CROSS/BLUE SHIELD | Source: Ambulatory Visit | Attending: Family Medicine | Admitting: Family Medicine

## 2019-12-28 DIAGNOSIS — Z1231 Encounter for screening mammogram for malignant neoplasm of breast: Secondary | ICD-10-CM

## 2020-01-24 ENCOUNTER — Ambulatory Visit: Payer: 59 | Attending: Internal Medicine

## 2020-01-24 DIAGNOSIS — Z23 Encounter for immunization: Secondary | ICD-10-CM

## 2020-01-24 NOTE — Progress Notes (Signed)
   Covid-19 Vaccination Clinic  Name:  [name]    MRN: [id number] DOB: Apr 27, 1956  01/24/2020  Ms. [name] was observed post Covid-19 immunization for 30 minutes based on pre-vaccination screening without incident. She was provided with Vaccine Information Sheet and instruction to access the V-Safe system.   Ms. [name] was instructed to call 911 with any severe reactions post vaccine: Marland Kitchen Difficulty breathing  . Swelling of face and throat  . A fast heartbeat  . A bad rash all over body  . Dizziness and weakness   Immunizations Administered    Name Date Dose VIS Date Route   Pfizer COVID-19 Vaccine 01/24/2020 11:01 AM 0.3 mL 10/12/2019 Intramuscular   Manufacturer: [employer], [employer]   Lot: [id number]   NDC: [address]-0

## 2020-02-18 ENCOUNTER — Ambulatory Visit: Payer: 59 | Attending: Internal Medicine

## 2020-02-18 DIAGNOSIS — Z23 Encounter for immunization: Secondary | ICD-10-CM

## 2020-02-18 NOTE — Progress Notes (Signed)
   Covid-19 Vaccination Clinic  Name:  [name]    MRN: [id number] DOB: [birth year]  02/18/2020  [name] was observed post Covid-19 immunization for 30 minutes based on pre-vaccination screening without incident. She was provided with Vaccine Information Sheet and instruction to access the V-Safe system.   [name] was instructed to call 911 with any severe reactions post vaccine: Marland Kitchen Difficulty breathing  . Swelling of face and throat  . A fast heartbeat  . A bad rash all over body  . Dizziness and weakness   Immunizations Administered    Name Date Dose VIS Date Route   Pfizer COVID-19 Vaccine 02/18/2020 10:30 AM 0.3 mL 12/26/2018 Intramuscular   Manufacturer: [employer], [employer]   Lot: [id number]   NDC: [address]-9

## 2020-07-31 DIAGNOSIS — H524 Presbyopia: Secondary | ICD-10-CM | POA: Diagnosis not present

## 2020-07-31 DIAGNOSIS — H2513 Age-related nuclear cataract, bilateral: Secondary | ICD-10-CM | POA: Diagnosis not present

## 2020-07-31 DIAGNOSIS — H52223 Regular astigmatism, bilateral: Secondary | ICD-10-CM | POA: Diagnosis not present

## 2020-07-31 DIAGNOSIS — H5203 Hypermetropia, bilateral: Secondary | ICD-10-CM | POA: Diagnosis not present

## 2020-09-04 DIAGNOSIS — I10 Essential (primary) hypertension: Secondary | ICD-10-CM | POA: Diagnosis not present

## 2020-09-04 DIAGNOSIS — E7800 Pure hypercholesterolemia, unspecified: Secondary | ICD-10-CM | POA: Diagnosis not present

## 2020-09-04 DIAGNOSIS — E876 Hypokalemia: Secondary | ICD-10-CM | POA: Diagnosis not present

## 2020-09-04 DIAGNOSIS — E039 Hypothyroidism, unspecified: Secondary | ICD-10-CM | POA: Diagnosis not present

## 2020-10-01 DIAGNOSIS — Z0120 Encounter for dental examination and cleaning without abnormal findings: Secondary | ICD-10-CM | POA: Diagnosis not present

## 2020-11-27 ENCOUNTER — Encounter: Payer: Self-pay | Admitting: Internal Medicine

## 2020-12-03 ENCOUNTER — Other Ambulatory Visit: Payer: Self-pay | Admitting: Family Medicine

## 2020-12-03 DIAGNOSIS — Z1231 Encounter for screening mammogram for malignant neoplasm of breast: Secondary | ICD-10-CM

## 2020-12-05 ENCOUNTER — Ambulatory Visit (INDEPENDENT_AMBULATORY_CARE_PROVIDER_SITE_OTHER): Payer: Medicare Other | Admitting: Endocrinology

## 2020-12-05 ENCOUNTER — Other Ambulatory Visit: Payer: Self-pay

## 2020-12-05 ENCOUNTER — Encounter: Payer: Self-pay | Admitting: Endocrinology

## 2020-12-05 DIAGNOSIS — E876 Hypokalemia: Secondary | ICD-10-CM | POA: Diagnosis not present

## 2020-12-05 LAB — BASIC METABOLIC PANEL
BUN: 12 mg/dL (ref 6–23)
CO2: 27 mEq/L (ref 19–32)
Calcium: 9.4 mg/dL (ref 8.4–10.5)
Chloride: 101 mEq/L (ref 96–112)
Creatinine, Ser: 0.68 mg/dL (ref 0.40–1.20)
GFR: 91.4 mL/min (ref >60.00)
Glucose, Bld: 86 mg/dL (ref 70–99)
Potassium: 3.7 mEq/L (ref 3.5–5.1)
Sodium: 139 mEq/L (ref 135–145)

## 2020-12-05 NOTE — Patient Instructions (Signed)
Blood tests are requested for you today.  We'll let you know about the results.   If this test is normal, the next step is to check a 24HR urine test for potassium.

## 2020-12-05 NOTE — Progress Notes (Signed)
Subjective:    Patient ID: [name], female    DOB: 08/01/1956, 65 y.o.   MRN: [id number]  HPI Pt is ref by Dr Docia Chuck, for hypokalemia.  This was dx'ed in 2021.  she has no h/o adrenal disease, kidney problems, or cancer. pt states she feels well in general.  She has chronic intermitt diarrhea (uncertain etiology).  She stopped KLOR, due to GI sxs.  She has never had adrenal imaging.  She does not eat licorice.   Past Medical History:  Diagnosis Date  . Colon perforation (HCC) 01/2008   With Colonoscopy  . Hyperlipidemia   . Hypertension   . Hypothyroid 2014    Past Surgical History:  Procedure Laterality Date  . BREAST BIOPSY Right    benign    Social History   Socioeconomic History  . Marital status: Divorced    Spouse name: Not on file  . Number of children: Not on file  . Years of education: Not on file  . Highest education level: Not on file  Occupational History  . Not on file  Tobacco Use  . Smoking status: Former Smoker    Packs/day: 1.50    Years: 20.00    Pack years: 30.00    Quit date: 11/01/1998    Years since quitting: 22.1  . Smokeless tobacco: Never Used  Substance and Sexual Activity  . Alcohol use: Yes    Alcohol/week: 3.0 standard drinks    Types: 3 Standard drinks or equivalent per week  . Drug use: No  . Sexual activity: Never    Partners: Male    Birth control/protection: Post-menopausal  Other Topics Concern  . Not on file  Social History Narrative  . Not on file   Social Determinants of Health   Financial Resource Strain: Not on file  Food Insecurity: Not on file  Transportation Needs: Not on file  Physical Activity: Not on file  Stress: Not on file  Social Connections: Not on file  Intimate Partner Violence: Not on file    Current Outpatient Medications on File Prior to Visit  Medication Sig Dispense Refill  . atorvastatin (LIPITOR) 10 MG tablet Take 1 tablet by mouth daily.    . fenofibrate micronized (ANTARA) 43 MG  capsule Take 1 capsule by mouth daily.  6  . levothyroxine (SYNTHROID) 75 MCG tablet Take 1 tablet (75 mcg total) by mouth daily before breakfast. 90 tablet 3  . Multiple Vitamins-Minerals (MULTIVITAMIN PO) Take by mouth.     No current facility-administered medications on file prior to visit.    Allergies  Allergen Reactions  . Asa [Aspirin] Anaphylaxis and Swelling  . Motrin [Ibuprofen] Anaphylaxis    Family History  Problem Relation Age of Onset  . Hyperlipidemia Father   . Heart disease Father        CABG x 4  . Cancer Maternal Grandmother        unknown  . Cancer Maternal Grandfather        unknown  . Diabetes Paternal Grandfather   . Osteoporosis Neg Hx   . Adrenal disorder Neg Hx     BP 130/70 (BP Location: Right Arm, Patient Position: Sitting, Cuff Size: Normal)   Pulse 96   Ht 5\' 6"  (1.676 m)   Wt 146 lb 3.2 oz (66.3 kg)   LMP 11/01/2006   SpO2 99%   BMI 23.60 kg/m    Review of Systems He denies polyuria, numbness, muscle cramps, n/v, headache, and weight change.  Objective:   Physical Exam VS: see vs page GEN: no distress HEAD: head: no deformity eyes: no periorbital swelling, no proptosis.   external nose and ears are normal NECK: supple, thyroid is not enlarged CHEST WALL: no deformity LUNGS: clear to auscultation CV: reg rate and rhythm, no murmur.  MUSCULOSKELETAL: gait is normal and steady EXTEMITIES: no deformity.  no leg edema NEURO:  readily moves all 4's.  sensation is intact to touch on all 4's SKIN:  Normal texture and temperature.  No rash or suspicious lesion is visible.   NODES:  None palpable at the neck PSYCH: alert, well-oriented.  Does not appear anxious nor depressed.  CT (2009): adrenals are normal..    I have reviewed outside records, and summarized: Pt was noted to have hypokalemia, and referred here.  HTN, dyslipidemia, and hypothyroidism were also addressed.    Assessment & Plan:  Hypokalemia, new to me, uncertain  etiology and prognosis.  Pt is advised to stay off KLOR  Patient Instructions  Blood tests are requested for you today.  We'll let you know about the results.   If this test is normal, the next step is to check a 24HR urine test for potassium.

## 2020-12-15 ENCOUNTER — Other Ambulatory Visit: Payer: Self-pay | Admitting: Endocrinology

## 2020-12-15 DIAGNOSIS — E876 Hypokalemia: Secondary | ICD-10-CM

## 2020-12-15 LAB — ALDOSTERONE + RENIN ACTIVITY W/ RATIO
ALDO / PRA Ratio: 0.7 Ratio — ABNORMAL LOW (ref 0.9–28.9)
Aldosterone: 2 ng/dL
Renin Activity: 2.73 ng/mL/h (ref 0.25–5.82)

## 2021-01-20 ENCOUNTER — Other Ambulatory Visit: Payer: Self-pay

## 2021-01-20 ENCOUNTER — Ambulatory Visit
Admission: RE | Admit: 2021-01-20 | Discharge: 2021-01-20 | Disposition: A | Discharge status: Home / Self Care | Payer: Medicare Other | Source: Ambulatory Visit | Attending: Family Medicine | Admitting: Family Medicine

## 2021-01-20 DIAGNOSIS — Z1231 Encounter for screening mammogram for malignant neoplasm of breast: Secondary | ICD-10-CM

## 2021-03-06 DIAGNOSIS — E7800 Pure hypercholesterolemia, unspecified: Secondary | ICD-10-CM | POA: Diagnosis not present

## 2021-03-06 DIAGNOSIS — Z0000 Encounter for general adult medical examination without abnormal findings: Secondary | ICD-10-CM | POA: Diagnosis not present

## 2021-03-06 DIAGNOSIS — E039 Hypothyroidism, unspecified: Secondary | ICD-10-CM | POA: Diagnosis not present

## 2021-03-06 DIAGNOSIS — Z23 Encounter for immunization: Secondary | ICD-10-CM | POA: Diagnosis not present

## 2021-03-10 ENCOUNTER — Other Ambulatory Visit: Payer: Self-pay | Admitting: Family Medicine

## 2021-03-10 DIAGNOSIS — E2839 Other primary ovarian failure: Secondary | ICD-10-CM

## 2021-10-02 ENCOUNTER — Ambulatory Visit
Admission: RE | Admit: 2021-10-02 | Discharge: 2021-10-02 | Disposition: A | Discharge status: Home / Self Care | Payer: Medicare Other | Source: Ambulatory Visit | Attending: Family Medicine | Admitting: Family Medicine

## 2021-10-02 DIAGNOSIS — E2839 Other primary ovarian failure: Secondary | ICD-10-CM

## 2021-10-02 DIAGNOSIS — Z780 Asymptomatic menopausal state: Secondary | ICD-10-CM | POA: Diagnosis not present

## 2021-10-02 DIAGNOSIS — M8589 Other specified disorders of bone density and structure, multiple sites: Secondary | ICD-10-CM | POA: Diagnosis not present

## 2021-12-30 ENCOUNTER — Other Ambulatory Visit: Payer: Self-pay | Admitting: Family Medicine

## 2021-12-30 DIAGNOSIS — Z1231 Encounter for screening mammogram for malignant neoplasm of breast: Secondary | ICD-10-CM

## 2022-01-21 ENCOUNTER — Ambulatory Visit
Admission: RE | Admit: 2022-01-21 | Discharge: 2022-01-21 | Disposition: A | Discharge status: Home / Self Care | Payer: Medicare Other | Source: Ambulatory Visit | Attending: Family Medicine | Admitting: Family Medicine

## 2022-01-21 DIAGNOSIS — Z1231 Encounter for screening mammogram for malignant neoplasm of breast: Secondary | ICD-10-CM

## 2022-03-24 DIAGNOSIS — Z79899 Other long term (current) drug therapy: Secondary | ICD-10-CM | POA: Diagnosis not present

## 2022-03-24 DIAGNOSIS — E039 Hypothyroidism, unspecified: Secondary | ICD-10-CM | POA: Diagnosis not present

## 2022-03-24 DIAGNOSIS — Z0000 Encounter for general adult medical examination without abnormal findings: Secondary | ICD-10-CM | POA: Diagnosis not present

## 2022-03-24 DIAGNOSIS — E7800 Pure hypercholesterolemia, unspecified: Secondary | ICD-10-CM | POA: Diagnosis not present

## 2022-03-24 DIAGNOSIS — I10 Essential (primary) hypertension: Secondary | ICD-10-CM | POA: Diagnosis not present

## 2022-04-14 DIAGNOSIS — M5451 Vertebrogenic low back pain: Secondary | ICD-10-CM | POA: Diagnosis not present

## 2022-04-14 DIAGNOSIS — M25551 Pain in right hip: Secondary | ICD-10-CM | POA: Diagnosis not present

## 2022-04-14 DIAGNOSIS — M6281 Muscle weakness (generalized): Secondary | ICD-10-CM | POA: Diagnosis not present

## 2022-04-19 DIAGNOSIS — M5451 Vertebrogenic low back pain: Secondary | ICD-10-CM | POA: Diagnosis not present

## 2022-04-19 DIAGNOSIS — M25551 Pain in right hip: Secondary | ICD-10-CM | POA: Diagnosis not present

## 2022-04-19 DIAGNOSIS — M6281 Muscle weakness (generalized): Secondary | ICD-10-CM | POA: Diagnosis not present

## 2022-04-26 DIAGNOSIS — M5451 Vertebrogenic low back pain: Secondary | ICD-10-CM | POA: Diagnosis not present

## 2022-04-26 DIAGNOSIS — M6281 Muscle weakness (generalized): Secondary | ICD-10-CM | POA: Diagnosis not present

## 2022-04-26 DIAGNOSIS — M25551 Pain in right hip: Secondary | ICD-10-CM | POA: Diagnosis not present

## 2022-04-28 DIAGNOSIS — M6281 Muscle weakness (generalized): Secondary | ICD-10-CM | POA: Diagnosis not present

## 2022-04-28 DIAGNOSIS — M25551 Pain in right hip: Secondary | ICD-10-CM | POA: Diagnosis not present

## 2022-04-28 DIAGNOSIS — M5451 Vertebrogenic low back pain: Secondary | ICD-10-CM | POA: Diagnosis not present

## 2022-05-12 DIAGNOSIS — M5451 Vertebrogenic low back pain: Secondary | ICD-10-CM | POA: Diagnosis not present

## 2022-05-12 DIAGNOSIS — M6281 Muscle weakness (generalized): Secondary | ICD-10-CM | POA: Diagnosis not present

## 2022-05-12 DIAGNOSIS — M25551 Pain in right hip: Secondary | ICD-10-CM | POA: Diagnosis not present

## 2022-05-17 DIAGNOSIS — M5451 Vertebrogenic low back pain: Secondary | ICD-10-CM | POA: Diagnosis not present

## 2022-05-17 DIAGNOSIS — M25551 Pain in right hip: Secondary | ICD-10-CM | POA: Diagnosis not present

## 2022-05-17 DIAGNOSIS — M6281 Muscle weakness (generalized): Secondary | ICD-10-CM | POA: Diagnosis not present

## 2022-05-19 DIAGNOSIS — M6281 Muscle weakness (generalized): Secondary | ICD-10-CM | POA: Diagnosis not present

## 2022-05-19 DIAGNOSIS — M5451 Vertebrogenic low back pain: Secondary | ICD-10-CM | POA: Diagnosis not present

## 2022-05-19 DIAGNOSIS — M25551 Pain in right hip: Secondary | ICD-10-CM | POA: Diagnosis not present

## 2022-05-24 DIAGNOSIS — M25551 Pain in right hip: Secondary | ICD-10-CM | POA: Diagnosis not present

## 2022-05-24 DIAGNOSIS — M5451 Vertebrogenic low back pain: Secondary | ICD-10-CM | POA: Diagnosis not present

## 2022-05-24 DIAGNOSIS — M6281 Muscle weakness (generalized): Secondary | ICD-10-CM | POA: Diagnosis not present

## 2022-12-01 IMAGING — MG MM DIGITAL SCREENING BILAT W/ TOMO AND CAD
8 series · 8 of 24 positions shown · non-contrast
Comparison: Previous exam(s).

CLINICAL DATA: Screening.

EXAM:
DIGITAL SCREENING BILATERAL MAMMOGRAM WITH TOMOSYNTHESIS AND CAD
TECHNIQUE: Bilateral screening digital craniocaudal and mediolateral oblique
mammograms were obtained. Bilateral screening digital breast
tomosynthesis was performed. The images were evaluated with
computer-aided detection.

[R CC synth-2D]
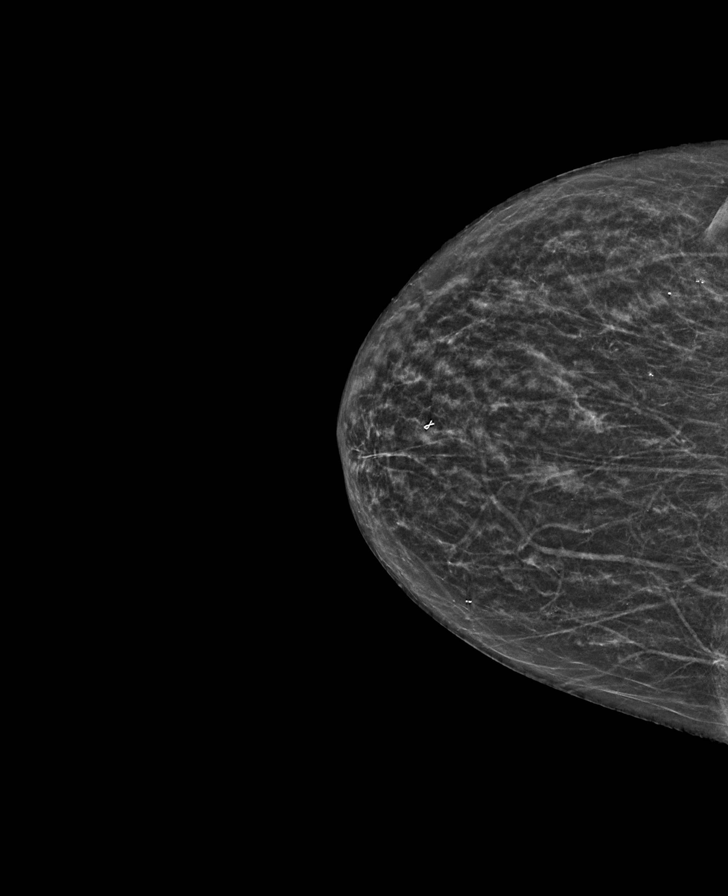

[L MLO synth-2D]
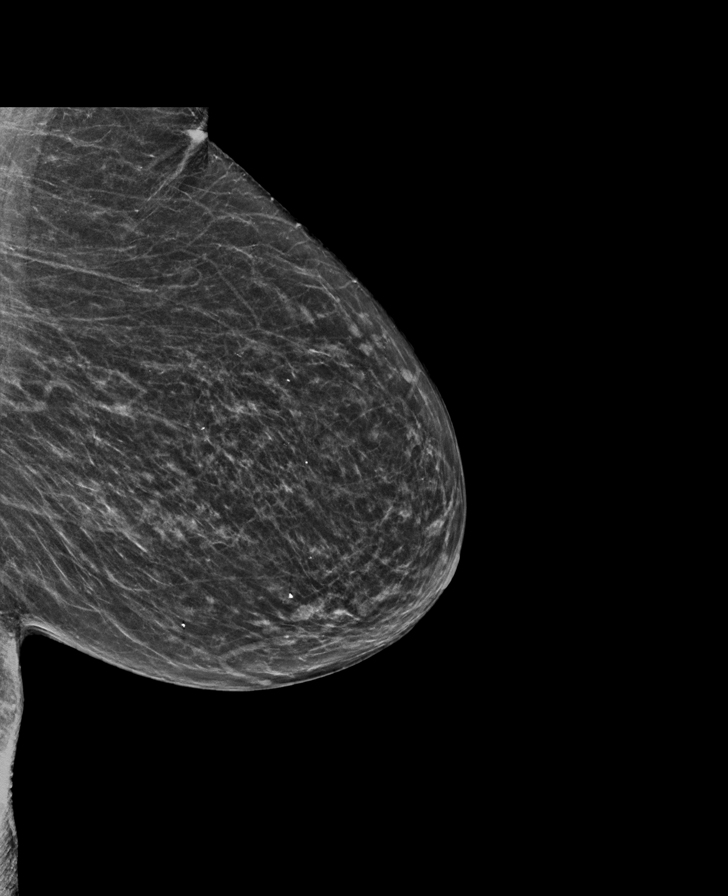

[L CC synth-2D]
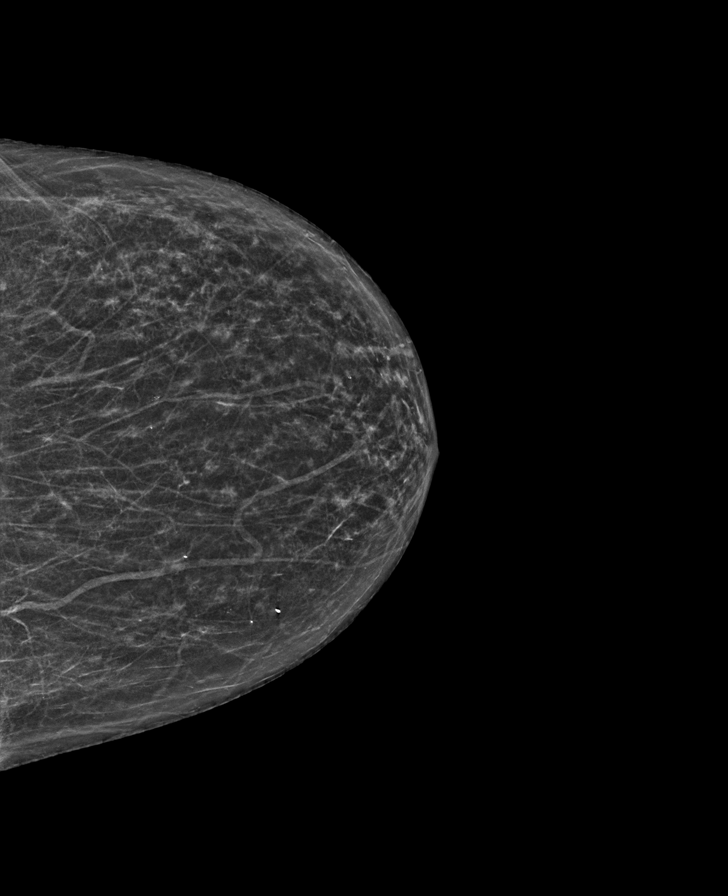

[R MLO synth-2D]
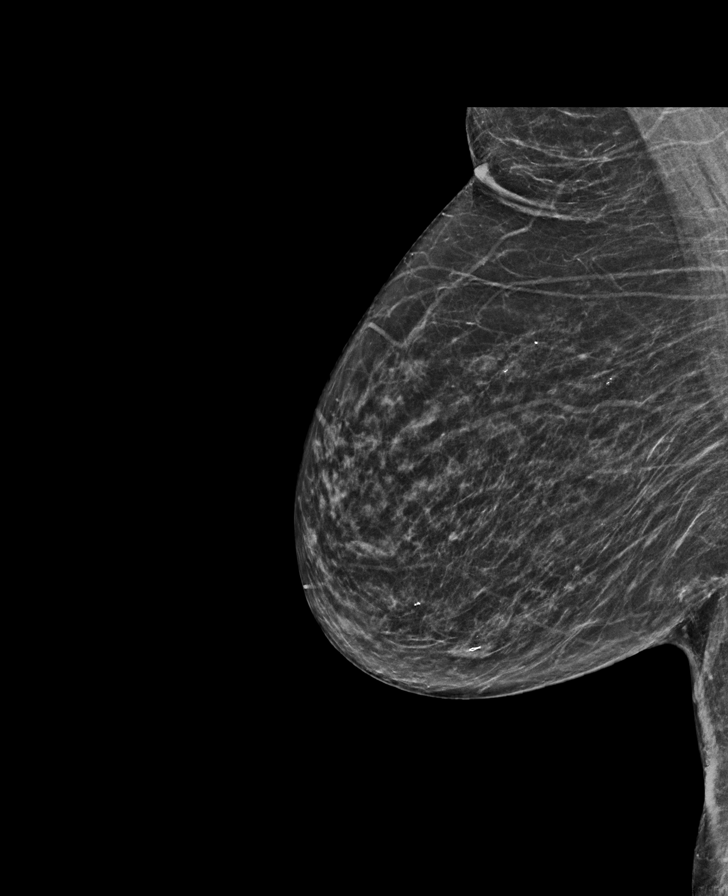

[L MLO tomo · tomo slice 27/54.0]
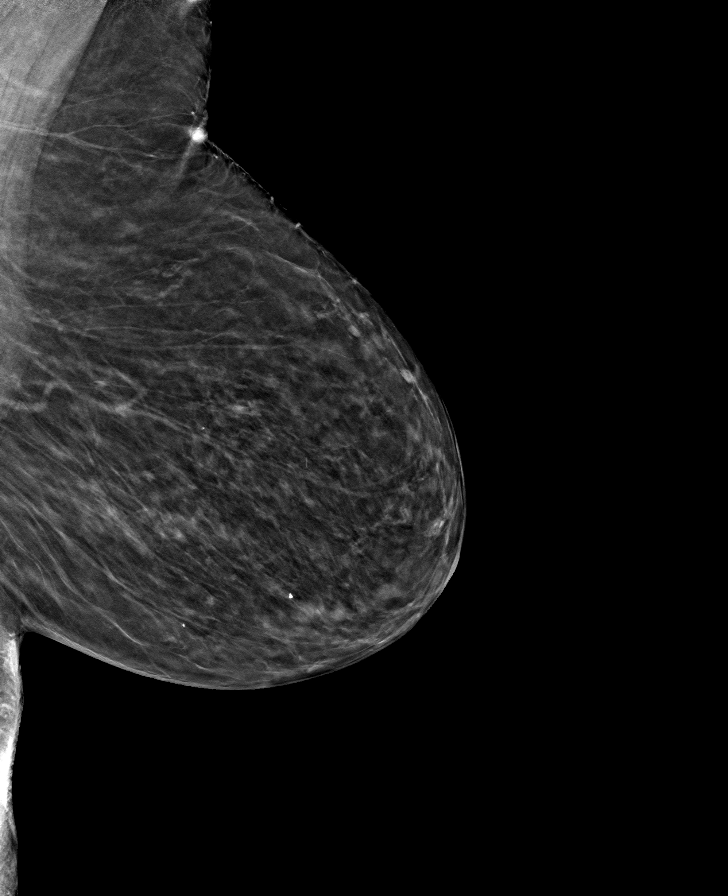

[R CC tomo · tomo slice 23/44.0]
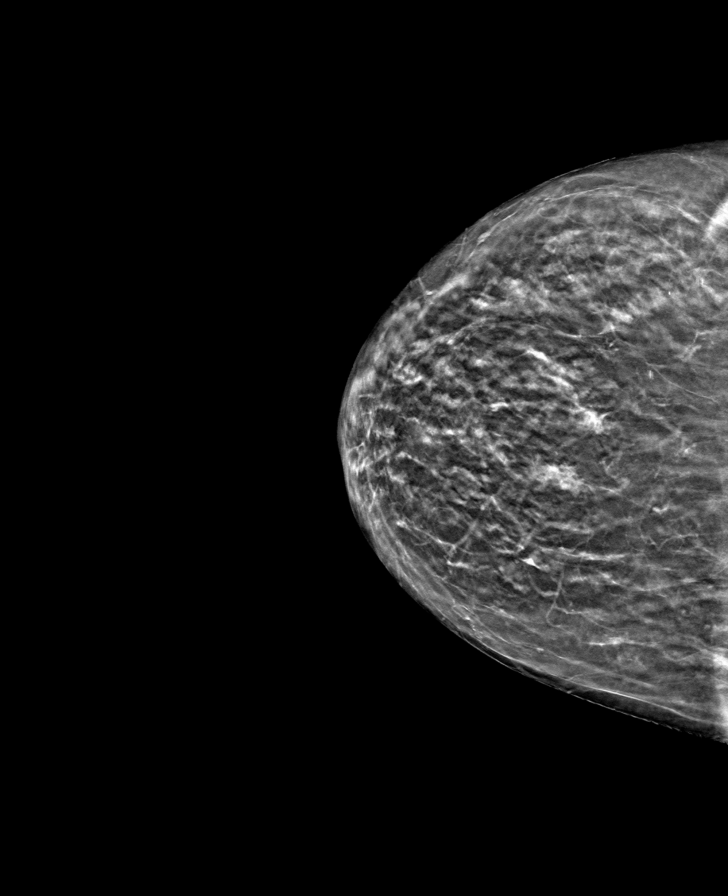

[L CC tomo · tomo slice 24/47.0]
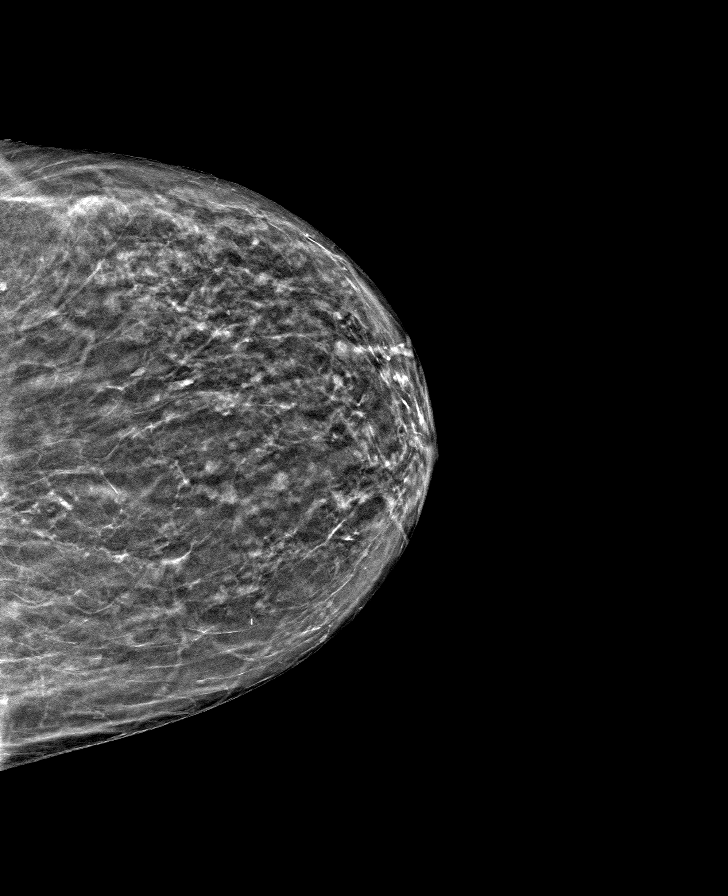

[R MLO tomo · tomo slice 28/55.0]
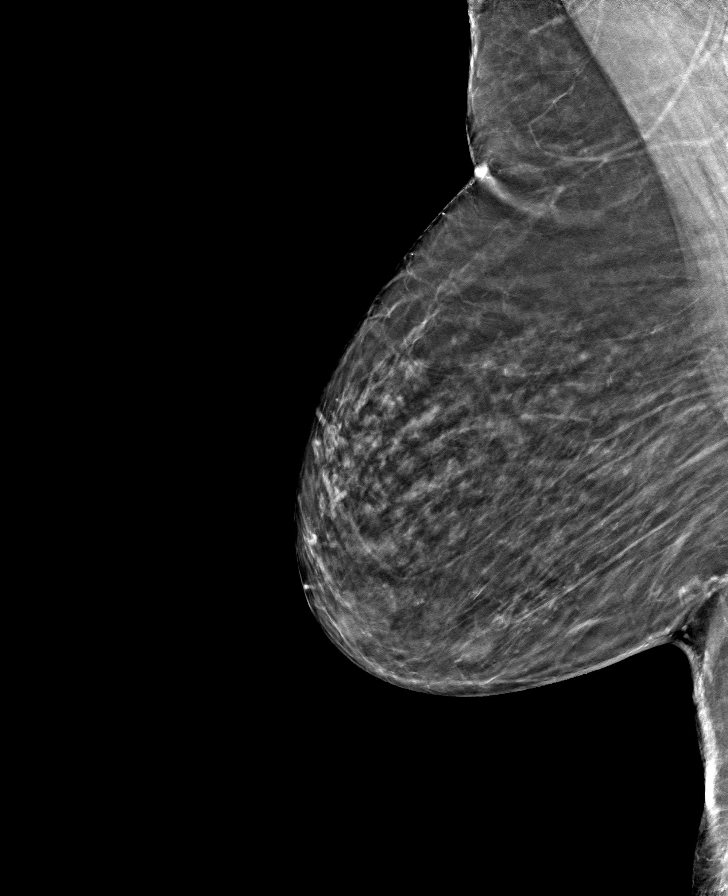

[8 of 24 positions shown; findings below may reference images not displayed]

ACR Breast Density Category b: There are scattered areas of
fibroglandular density.
FINDINGS: There are no findings suspicious for malignancy.
IMPRESSION: No mammographic evidence of malignancy. A result letter of this
screening mammogram will be mailed directly to the patient.

RECOMMENDATION:
Screening mammogram in one year. (Code:51-O-LD2)

BI-RADS CATEGORY  1: Negative.

## 2023-01-05 DIAGNOSIS — K080 Exfoliation of teeth due to systemic causes: Secondary | ICD-10-CM | POA: Diagnosis not present

## 2023-01-11 ENCOUNTER — Other Ambulatory Visit: Payer: Self-pay | Admitting: Family Medicine

## 2023-01-11 DIAGNOSIS — Z1231 Encounter for screening mammogram for malignant neoplasm of breast: Secondary | ICD-10-CM

## 2023-03-01 ENCOUNTER — Ambulatory Visit
Admission: RE | Admit: 2023-03-01 | Discharge: 2023-03-01 | Disposition: A | Discharge status: Home / Self Care | Payer: Medicare Other | Source: Ambulatory Visit | Attending: Family Medicine | Admitting: Family Medicine

## 2023-03-01 DIAGNOSIS — Z1231 Encounter for screening mammogram for malignant neoplasm of breast: Secondary | ICD-10-CM | POA: Diagnosis not present

## 2023-04-15 DIAGNOSIS — Z79899 Other long term (current) drug therapy: Secondary | ICD-10-CM | POA: Diagnosis not present

## 2023-04-15 DIAGNOSIS — E7800 Pure hypercholesterolemia, unspecified: Secondary | ICD-10-CM | POA: Diagnosis not present

## 2023-04-15 DIAGNOSIS — Z23 Encounter for immunization: Secondary | ICD-10-CM | POA: Diagnosis not present

## 2023-04-15 DIAGNOSIS — Z0000 Encounter for general adult medical examination without abnormal findings: Secondary | ICD-10-CM | POA: Diagnosis not present

## 2023-04-15 DIAGNOSIS — E039 Hypothyroidism, unspecified: Secondary | ICD-10-CM | POA: Diagnosis not present

## 2023-04-15 DIAGNOSIS — I10 Essential (primary) hypertension: Secondary | ICD-10-CM | POA: Diagnosis not present

## 2023-04-20 DIAGNOSIS — K080 Exfoliation of teeth due to systemic causes: Secondary | ICD-10-CM | POA: Diagnosis not present

## 2023-08-11 DIAGNOSIS — K080 Exfoliation of teeth due to systemic causes: Secondary | ICD-10-CM | POA: Diagnosis not present

## 2023-11-15 DIAGNOSIS — K080 Exfoliation of teeth due to systemic causes: Secondary | ICD-10-CM | POA: Diagnosis not present

## 2024-01-19 DIAGNOSIS — I10 Essential (primary) hypertension: Secondary | ICD-10-CM | POA: Diagnosis not present

## 2024-01-19 DIAGNOSIS — R600 Localized edema: Secondary | ICD-10-CM | POA: Diagnosis not present

## 2024-03-06 ENCOUNTER — Other Ambulatory Visit: Payer: Self-pay | Admitting: Family Medicine

## 2024-03-06 DIAGNOSIS — Z1231 Encounter for screening mammogram for malignant neoplasm of breast: Secondary | ICD-10-CM

## 2024-03-22 ENCOUNTER — Ambulatory Visit
Admission: RE | Admit: 2024-03-22 | Discharge: 2024-03-22 | Disposition: A | Discharge status: Home / Self Care | Source: Ambulatory Visit | Attending: Family Medicine | Admitting: Family Medicine

## 2024-03-22 DIAGNOSIS — Z1231 Encounter for screening mammogram for malignant neoplasm of breast: Secondary | ICD-10-CM

## 2024-04-18 DIAGNOSIS — E039 Hypothyroidism, unspecified: Secondary | ICD-10-CM | POA: Diagnosis not present

## 2024-04-18 DIAGNOSIS — Z79899 Other long term (current) drug therapy: Secondary | ICD-10-CM | POA: Diagnosis not present

## 2024-04-18 DIAGNOSIS — Z0001 Encounter for general adult medical examination with abnormal findings: Secondary | ICD-10-CM | POA: Diagnosis not present

## 2024-04-18 DIAGNOSIS — Z1331 Encounter for screening for depression: Secondary | ICD-10-CM | POA: Diagnosis not present

## 2024-04-18 DIAGNOSIS — I10 Essential (primary) hypertension: Secondary | ICD-10-CM | POA: Diagnosis not present

## 2024-04-18 DIAGNOSIS — E7800 Pure hypercholesterolemia, unspecified: Secondary | ICD-10-CM | POA: Diagnosis not present

## 2024-04-19 ENCOUNTER — Other Ambulatory Visit (HOSPITAL_BASED_OUTPATIENT_CLINIC_OR_DEPARTMENT_OTHER): Payer: Self-pay | Admitting: Family Medicine

## 2024-04-19 DIAGNOSIS — E2839 Other primary ovarian failure: Secondary | ICD-10-CM

## 2024-05-16 DIAGNOSIS — K080 Exfoliation of teeth due to systemic causes: Secondary | ICD-10-CM | POA: Diagnosis not present

## 2024-05-18 DIAGNOSIS — S62609A Fracture of unspecified phalanx of unspecified finger, initial encounter for closed fracture: Secondary | ICD-10-CM | POA: Diagnosis not present

## 2024-05-18 DIAGNOSIS — S6990XA Unspecified injury of unspecified wrist, hand and finger(s), initial encounter: Secondary | ICD-10-CM | POA: Diagnosis not present

## 2024-05-21 DIAGNOSIS — S62617A Displaced fracture of proximal phalanx of left little finger, initial encounter for closed fracture: Secondary | ICD-10-CM | POA: Diagnosis not present

## 2024-05-29 DIAGNOSIS — H5203 Hypermetropia, bilateral: Secondary | ICD-10-CM | POA: Diagnosis not present

## 2024-05-30 DIAGNOSIS — M79645 Pain in left finger(s): Secondary | ICD-10-CM | POA: Diagnosis not present

## 2024-05-30 DIAGNOSIS — R29898 Other symptoms and signs involving the musculoskeletal system: Secondary | ICD-10-CM | POA: Diagnosis not present

## 2024-05-30 DIAGNOSIS — M25642 Stiffness of left hand, not elsewhere classified: Secondary | ICD-10-CM | POA: Diagnosis not present

## 2024-06-06 DIAGNOSIS — M79645 Pain in left finger(s): Secondary | ICD-10-CM | POA: Diagnosis not present

## 2024-06-06 DIAGNOSIS — M25642 Stiffness of left hand, not elsewhere classified: Secondary | ICD-10-CM | POA: Diagnosis not present

## 2024-06-06 DIAGNOSIS — R29898 Other symptoms and signs involving the musculoskeletal system: Secondary | ICD-10-CM | POA: Diagnosis not present

## 2024-06-13 DIAGNOSIS — M25642 Stiffness of left hand, not elsewhere classified: Secondary | ICD-10-CM | POA: Diagnosis not present

## 2024-06-13 DIAGNOSIS — M79645 Pain in left finger(s): Secondary | ICD-10-CM | POA: Diagnosis not present

## 2024-06-13 DIAGNOSIS — R29898 Other symptoms and signs involving the musculoskeletal system: Secondary | ICD-10-CM | POA: Diagnosis not present

## 2024-06-19 DIAGNOSIS — S62617P Displaced fracture of proximal phalanx of left little finger, subsequent encounter for fracture with malunion: Secondary | ICD-10-CM | POA: Diagnosis not present

## 2024-06-20 DIAGNOSIS — R29898 Other symptoms and signs involving the musculoskeletal system: Secondary | ICD-10-CM | POA: Diagnosis not present

## 2024-06-20 DIAGNOSIS — M25642 Stiffness of left hand, not elsewhere classified: Secondary | ICD-10-CM | POA: Diagnosis not present

## 2024-06-20 DIAGNOSIS — M79645 Pain in left finger(s): Secondary | ICD-10-CM | POA: Diagnosis not present

## 2024-06-25 DIAGNOSIS — H2512 Age-related nuclear cataract, left eye: Secondary | ICD-10-CM | POA: Diagnosis not present

## 2024-07-18 DIAGNOSIS — H25812 Combined forms of age-related cataract, left eye: Secondary | ICD-10-CM | POA: Diagnosis not present

## 2024-07-18 DIAGNOSIS — E039 Hypothyroidism, unspecified: Secondary | ICD-10-CM | POA: Diagnosis not present

## 2024-07-18 DIAGNOSIS — I10 Essential (primary) hypertension: Secondary | ICD-10-CM | POA: Diagnosis not present

## 2024-07-18 DIAGNOSIS — H2512 Age-related nuclear cataract, left eye: Secondary | ICD-10-CM | POA: Diagnosis not present

## 2024-08-01 DIAGNOSIS — I10 Essential (primary) hypertension: Secondary | ICD-10-CM | POA: Diagnosis not present

## 2024-08-01 DIAGNOSIS — E039 Hypothyroidism, unspecified: Secondary | ICD-10-CM | POA: Diagnosis not present

## 2024-08-01 DIAGNOSIS — H25811 Combined forms of age-related cataract, right eye: Secondary | ICD-10-CM | POA: Diagnosis not present

## 2024-11-21 NOTE — Progress Notes (Signed)
 VIOLIA KNOPF                                          MRN: [id number]   11/21/2024   The [facility] Quality Team Specialist reviewed this patient medical record for the purposes of chart review for care gap closure. The following were reviewed: chart review for care gap closure-colorectal cancer screening.    [facility] Quality Team
